# Patient Record
Sex: Male | Born: 1935 | Race: White | Hispanic: No | Smoking: Never smoker
Health system: Southern US, Community
[De-identification: ages and names within clinical notes are randomized; demographics above are authoritative.]

## PROBLEM LIST (undated history)

## (undated) DIAGNOSIS — H3530 Unspecified macular degeneration: Secondary | ICD-10-CM

## (undated) DIAGNOSIS — I10 Essential (primary) hypertension: Secondary | ICD-10-CM

## (undated) DIAGNOSIS — I219 Acute myocardial infarction, unspecified: Secondary | ICD-10-CM

## (undated) DIAGNOSIS — J449 Chronic obstructive pulmonary disease, unspecified: Secondary | ICD-10-CM

## (undated) DIAGNOSIS — C44519 Basal cell carcinoma of skin of other part of trunk: Secondary | ICD-10-CM

## (undated) DIAGNOSIS — H3560 Retinal hemorrhage, unspecified eye: Secondary | ICD-10-CM

## (undated) DIAGNOSIS — K219 Gastro-esophageal reflux disease without esophagitis: Secondary | ICD-10-CM

## (undated) DIAGNOSIS — I350 Nonrheumatic aortic (valve) stenosis: Secondary | ICD-10-CM

## (undated) DIAGNOSIS — I2510 Atherosclerotic heart disease of native coronary artery without angina pectoris: Secondary | ICD-10-CM

## (undated) DIAGNOSIS — C61 Malignant neoplasm of prostate: Secondary | ICD-10-CM

## (undated) HISTORY — PX: PROSTATECTOMY: SHX69

## (undated) HISTORY — PX: CATARACT EXTRACTION W/ INTRAOCULAR LENS IMPLANT: SHX1309

## (undated) HISTORY — PX: CATARACT EXTRACTION: SUR2

## (undated) HISTORY — PX: CORONARY ARTERY BYPASS GRAFT: SHX141

## (undated) HISTORY — PX: EYE SURGERY: SHX253

---

## 2011-10-03 ENCOUNTER — Ambulatory Visit: Payer: Self-pay | Admitting: Internal Medicine

## 2012-02-25 ENCOUNTER — Inpatient Hospital Stay: Payer: Self-pay | Admitting: Internal Medicine

## 2012-02-25 LAB — CBC
HCT: 40.8 % (ref 40.0–52.0)
HGB: 14.3 g/dL (ref 13.0–18.0)
MCH: 31.1 pg (ref 26.0–34.0)
MCV: 88 fL (ref 80–100)
RBC: 4.61 10*6/uL (ref 4.40–5.90)
RDW: 12.8 % (ref 11.5–14.5)
WBC: 7.3 10*3/uL (ref 3.8–10.6)

## 2012-02-25 LAB — COMPREHENSIVE METABOLIC PANEL
Albumin: 4.1 g/dL (ref 3.4–5.0)
Alkaline Phosphatase: 90 U/L (ref 50–136)
BUN: 23 mg/dL — ABNORMAL HIGH (ref 7–18)
Bilirubin,Total: 0.7 mg/dL (ref 0.2–1.0)
Calcium, Total: 9.3 mg/dL (ref 8.5–10.1)
Chloride: 103 mmol/L (ref 98–107)
Creatinine: 1.52 mg/dL — ABNORMAL HIGH (ref 0.60–1.30)
Glucose: 106 mg/dL — ABNORMAL HIGH (ref 65–99)
Osmolality: 278 (ref 275–301)
SGOT(AST): 25 U/L (ref 15–37)
SGPT (ALT): 26 U/L (ref 12–78)
Sodium: 137 mmol/L (ref 136–145)
Total Protein: 7.9 g/dL (ref 6.4–8.2)

## 2012-02-25 LAB — CK TOTAL AND CKMB (NOT AT ARMC)
CK, Total: 81 U/L (ref 35–232)
CK-MB: 2.4 ng/mL (ref 0.5–3.6)

## 2012-02-26 LAB — BASIC METABOLIC PANEL
Anion Gap: 8 (ref 7–16)
BUN: 18 mg/dL (ref 7–18)
Chloride: 108 mmol/L — ABNORMAL HIGH (ref 98–107)
Co2: 24 mmol/L (ref 21–32)
Creatinine: 1.46 mg/dL — ABNORMAL HIGH (ref 0.60–1.30)
Osmolality: 282 (ref 275–301)
Potassium: 3.9 mmol/L (ref 3.5–5.1)
Sodium: 140 mmol/L (ref 136–145)

## 2012-02-26 LAB — MAGNESIUM: Magnesium: 1.8 mg/dL

## 2012-02-26 LAB — LIPID PANEL
Cholesterol: 99 mg/dL (ref 0–200)
HDL Cholesterol: 24 mg/dL — ABNORMAL LOW (ref 40–60)
Triglycerides: 102 mg/dL (ref 0–200)
VLDL Cholesterol, Calc: 20 mg/dL (ref 5–40)

## 2012-11-13 LAB — URINALYSIS, COMPLETE
Bilirubin,UR: NEGATIVE
Blood: NEGATIVE
Glucose,UR: NEGATIVE mg/dL (ref 0–75)
Nitrite: NEGATIVE
WBC UR: 1 /HPF (ref 0–5)

## 2012-11-13 LAB — COMPREHENSIVE METABOLIC PANEL
Alkaline Phosphatase: 95 U/L (ref 50–136)
BUN: 15 mg/dL (ref 7–18)
Bilirubin,Total: 0.7 mg/dL (ref 0.2–1.0)
Calcium, Total: 8.9 mg/dL (ref 8.5–10.1)
Chloride: 100 mmol/L (ref 98–107)
Co2: 26 mmol/L (ref 21–32)
Creatinine: 1.36 mg/dL — ABNORMAL HIGH (ref 0.60–1.30)
EGFR (African American): 52 — ABNORMAL LOW
Osmolality: 268 (ref 275–301)
Potassium: 4.6 mmol/L (ref 3.5–5.1)
SGPT (ALT): 25 U/L (ref 12–78)
Total Protein: 7.3 g/dL (ref 6.4–8.2)

## 2012-11-13 LAB — CBC
HCT: 39.2 % — ABNORMAL LOW (ref 40.0–52.0)
HGB: 13.5 g/dL (ref 13.0–18.0)
MCH: 30.2 pg (ref 26.0–34.0)
MCHC: 34.6 g/dL (ref 32.0–36.0)
RBC: 4.49 10*6/uL (ref 4.40–5.90)
RDW: 12.8 % (ref 11.5–14.5)

## 2012-11-13 LAB — CK TOTAL AND CKMB (NOT AT ARMC): CK, Total: 121 U/L (ref 35–232)

## 2012-11-14 ENCOUNTER — Observation Stay: Payer: Self-pay | Admitting: Specialist

## 2012-11-14 LAB — BASIC METABOLIC PANEL
Calcium, Total: 8.7 mg/dL (ref 8.5–10.1)
Chloride: 99 mmol/L (ref 98–107)
Co2: 27 mmol/L (ref 21–32)
Creatinine: 1.51 mg/dL — ABNORMAL HIGH (ref 0.60–1.30)
Osmolality: 268 (ref 275–301)
Sodium: 133 mmol/L — ABNORMAL LOW (ref 136–145)

## 2012-11-14 LAB — TROPONIN I: Troponin-I: 0.06 ng/mL — ABNORMAL HIGH

## 2012-11-14 LAB — CK TOTAL AND CKMB (NOT AT ARMC)
CK-MB: 0.7 ng/mL (ref 0.5–3.6)
CK-MB: 0.5 ng/mL (ref 0.5–3.6)

## 2012-11-15 LAB — CBC WITH DIFFERENTIAL/PLATELET
Basophil %: 0.4 %
Eosinophil %: 0.4 %
HGB: 12.4 g/dL — ABNORMAL LOW (ref 13.0–18.0)
Lymphocyte #: 0.7 10*3/uL — ABNORMAL LOW (ref 1.0–3.6)
Lymphocyte %: 14.7 %
MCH: 29.7 pg (ref 26.0–34.0)
MCHC: 34.1 g/dL (ref 32.0–36.0)
MCV: 87 fL (ref 80–100)
Monocyte %: 10.4 %
Platelet: 99 10*3/uL — ABNORMAL LOW (ref 150–440)
WBC: 4.6 10*3/uL (ref 3.8–10.6)

## 2012-11-15 LAB — BASIC METABOLIC PANEL
BUN: 18 mg/dL (ref 7–18)
Calcium, Total: 8.2 mg/dL — ABNORMAL LOW (ref 8.5–10.1)
Creatinine: 1.3 mg/dL (ref 0.60–1.30)
EGFR (African American): 55 — ABNORMAL LOW
Osmolality: 270 (ref 275–301)
Sodium: 134 mmol/L — ABNORMAL LOW (ref 136–145)

## 2012-11-15 LAB — MAGNESIUM: Magnesium: 1.5 mg/dL — ABNORMAL LOW

## 2012-11-15 LAB — LIPID PANEL
HDL Cholesterol: 20 mg/dL — ABNORMAL LOW (ref 40–60)
Triglycerides: 105 mg/dL (ref 0–200)

## 2012-11-20 LAB — CULTURE, BLOOD (SINGLE)

## 2014-01-05 DIAGNOSIS — I739 Peripheral vascular disease, unspecified: Secondary | ICD-10-CM

## 2014-01-05 DIAGNOSIS — I10 Essential (primary) hypertension: Secondary | ICD-10-CM | POA: Insufficient documentation

## 2014-01-05 DIAGNOSIS — I779 Disorder of arteries and arterioles, unspecified: Secondary | ICD-10-CM | POA: Insufficient documentation

## 2014-01-05 DIAGNOSIS — Z8679 Personal history of other diseases of the circulatory system: Secondary | ICD-10-CM | POA: Insufficient documentation

## 2014-01-05 DIAGNOSIS — D649 Anemia, unspecified: Secondary | ICD-10-CM | POA: Insufficient documentation

## 2014-01-05 DIAGNOSIS — E785 Hyperlipidemia, unspecified: Secondary | ICD-10-CM | POA: Insufficient documentation

## 2014-01-05 DIAGNOSIS — I219 Acute myocardial infarction, unspecified: Secondary | ICD-10-CM | POA: Insufficient documentation

## 2014-01-05 DIAGNOSIS — N289 Disorder of kidney and ureter, unspecified: Secondary | ICD-10-CM | POA: Insufficient documentation

## 2014-05-12 DIAGNOSIS — M5431 Sciatica, right side: Secondary | ICD-10-CM | POA: Insufficient documentation

## 2014-09-21 ENCOUNTER — Emergency Department: Payer: Self-pay | Admitting: Emergency Medicine

## 2014-10-13 NOTE — Discharge Summary (Signed)
PATIENT NAME:  Kevin Tyler, Kevin Tyler MR#:  [id number] DATE OF BIRTH:  Nov 11, 1917  DATE OF ADMISSION:  02/25/2012 DATE OF DISCHARGE:  02/26/2012  PRESENTING COMPLAINT: Dizziness and low blood pressure.   DISCHARGE DIAGNOSES:  1. Symptomatic bradycardia with hypotension, resolved.  2. Mild dehydration, improved with intravenous fluids.  3. Moderate to severe aortic stenosis by echocardiogram.  4. History of coronary disease, status post coronary artery bypass graft. Heart rate on discharge was 55 to 61.   MEDICATIONS:  1. Simvastatin 20 mg p.o. at bedtime.  2. Rapaflo 8 mg daily.  3. Prilosec 40 mg daily.  4. Aspirin 81 mg 2 tablets daily.  5. Ocuvite 1 tablet daily.  6. Tylenol extra strength 500 mg 1 tablet once a day.  7. Atenolol 25 mg p.o. daily.   DIET: Low sodium diet.   FOLLOW UP:  1. Follow up with Dr. Ubaldo Glassing in 1 to 2 weeks.  2. Dr. Raechel Ache in 1 to 2 weeks.   LABORATORY, DIAGNOSTIC AND RADIOLOGICAL DATA: Echo Doppler showed left atrium dilated, left ventricular systolic function normal. Right ventricle is borderline dilated. Mild to moderate AR. There is mild mitral regurgitation. Mitral valve leaflet appears thickened but well open. There is mild to moderate tricuspid regurgitation. Moderate to severe valvular aortic stenosis. Cardiac enzymes x3 negative. Magnesium 1.8, glucose 117, BUN 18, creatinine 1.4, sodium 140, potassium 3.9. Lipid profile within normal limits. CBC within normal limits except platelet count of 131. Comprehensive metabolic panel within normal limits except creatinine of 1.52 on admission.   CONSULTATION: Cardiology consultation with Dr. Ubaldo Glassing.   BRIEF SUMMARY OF HOSPITAL COURSE: [name] is a pleasant 81 year old Caucasian gentleman with history of coronary artery disease status post coronary artery bypass graft who comes to the Emergency Room with:  1. Near syncopal episode suspected from mild dehydration with hypotension and symptomatic bradycardia. Patient  had been on a atenolol 50 mg daily for long time. His atenolol was held at admission, he improved clinically well. He was seen by Dr. Ubaldo Glassing and since patient was doing well hemodynamically stable it was recommended by Dr. Ubaldo Glassing to start atenolol 25 mg daily. His echo showed moderate to severe AS. No chest pain and cardiac enzymes remained negative. Blood pressure was stable.  2. Acute renal failure/dehydration. Creatinine improved after hydration. Patient has been eating and drinking well.  3. Carotid artery stenosis 50% to 75% left. Continue aspirin.  4. Coronary artery disease status post coronary artery bypass graft in 1993. Continue aspirin and statins along with beta blockers.  5. Benign prostatic hypertrophy on Rapaflo  6. Hospital stay otherwise remained stable. CODE STATUS: Patient remained a FULL CODE.    TIME SPENT: 40 minutes.  ____________________________ [name], MD sap:cms D: 02/27/2012 15:14:08 ET T: 02/28/2012 13:19:08 ET JOB#: [id number]  cc: Shigeru Lampert A. Posey Pronto, MD, <Dictator> Javier Docker. Ubaldo Glassing, MD Christena Flake. Raechel Ache, MD Ilda Basset MD ELECTRONICALLY SIGNED 03/11/2012 22:13

## 2014-10-13 NOTE — Consult Note (Signed)
General Aspect 81 yo male with history of severe aortic stenosis with echo in 2012 revealeing an area of 0.99 cm2 with a peak gradient of 54 and a mean gradient of 32. He was seen in our office last week and scheduled for a repeat echo. He was in church yesterday when he began noting weakness and diapharesis. He was brought to the er where he was hypotenisive with sbp of 92 and heart rate in the 40-50's. He was taken off of his atenolol and placed on hydralazine for his blood pressure. He also was on rapaflo for his prostate. He has ruled out for an mi and is imrpoved clinically able to ambulate without difficulty. He feels back to his baseline and is anxous to go home.   Physical Exam:   GEN well developed, well nourished, no acute distress    HEENT hearing intact to voice    NECK supple    RESP normal resp effort  clear BS  no use of accessory muscles    CARD Regular rate and rhythm  Murmur    Murmur Systolic    Systolic Murmur Out flow    ABD denies tenderness  no liver/spleen enlargement  normal BS    LYMPH negative neck    EXTR negative cyanosis/clubbing, negative edema    SKIN normal to palpation    NEURO cranial nerves intact, motor/sensory function intact    PSYCH A+O to time, place, person, good insight   Review of Systems:   Subjective/Chief Complaint diapharesis    General: No Complaints    Skin: No Complaints    ENT: No Complaints    Eyes: No Complaints    Neck: No Complaints    Respiratory: No Complaints    Cardiovascular: No Complaints    Gastrointestinal: No Complaints    Genitourinary: No Complaints    Musculoskeletal: No Complaints    Neurologic: Dizzness    Hematologic: No Complaints    Endocrine: No Complaints    Psychiatric: No Complaints    Review of Systems: All other systems were reviewed and found to be negative    Medications/Allergies Reviewed Medications/Allergies reviewed     Cancer, Prostate:    Hypertension:     Cardiac Disease:   Home Medications: Medication Instructions Status  simvastatin 20 mg oral tablet 1 tab(s) orally once a day (at bedtime) Active  atenolol 50 mg oral tablet 1 tab(s) orally once a day Active  Rapaflo 8 mg oral capsule 1 cap(s) orally once a day Active  Prilosec 40 mg oral delayed release capsule 1 cap(s) orally once a day Active  aspirin 81 mg oral tablet 2 tab(s) orally once a day Active  Ocuvite oral tablet 1 tab(s) orally once a day Active  Tylenol Extra Strength PM 500 mg-25 mg oral tablet 1 tab(s) orally once a day (at bedtime), As Needed Active   EKG:   EKG NSR    NKA: Hives    Impression 81 yo male with history of aortic stenosis with ava of 0.99 cm2 in 2012 now admitted with dizziness and near syncope with diaphoresis occurring while in church yesterday. He has ruled out for an mi and is asymptomatic at present. He was relatively bradycardic on admission. Etiology of symptoms is likely relative volume depletion as evidenced by his renal funciton as well as a hot environment in church. His bradycardia is stable at present. Was taken off of his beta blocker on admission. He has been on 50 mg as an  outpatient. He is hypertensive at present. Was placed on hydralazine. Echo pending    Plan 1. Would discontinue hydralazine due to his AS. Would attempt to avoid afterload reduction. 2. Will carefully resume atenolol at 25 mg and follow heart rate, blood pressure and symptoms. 3. Review echo when available 4. Ambulate and discharge to home with outpatient follow up in my office later this week if stable.   Electronic Signatures: Teodoro Spray (MD)  (Signed 02-Sep-13 09:43)  Authored: General Aspect/Present Illness, History and Physical Exam, Review of System, Past Medical History, Home Medications, EKG , Allergies, Impression/Plan   Last Updated: 02-Sep-13 09:43 by Teodoro Spray (MD)

## 2014-10-13 NOTE — H&P (Signed)
PATIENT NAME:  Kevin Tyler, Kevin Tyler MR#:  [id number] DATE OF BIRTH:  [birth year]  DATE OF ADMISSION:  02/25/2012  PRIMARY CARE PHYSICIAN: Ezequiel Kayser, MD  REFERRING PHYSICIAN: Pollie Friar, MD  CHIEF COMPLAINT: Sweating and near syncope today.   HISTORY OF PRESENT ILLNESS: The patient is a 81 year old Caucasian male with a history of coronary artery disease, hypertension, left carotid stenosis of 50 to 70%, and benign prostatic hypertrophy who presented to the ED with the above chief complaint. The patient is alert, awake, and oriented, in no acute distress. The patient said he had sweating while standing in church and started to get hazy, felt weak and almost passed out and fell down. He was laid down in another room and felt better. He denies any loss of consciousness or seizure, no headache or dizziness, no chest pain, palpitations, orthopnea, or nocturnal dyspnea. No abdominal pain, nausea, vomiting, or diarrhea. The patient's blood pressure was 90/40 and treated with IV fluids in the ED.   PAST MEDICAL HISTORY: 1. Coronary artery disease.  2. Hypertension. 3. Left carotid stenosis.  4. Benign prostatic hypertrophy, on Rapaflo.   SOCIAL HISTORY: No smoking, drinking, or illicit drugs. He living alone at home.   PAST SURGICAL HISTORY: None.  FAMILY HISTORY: No hypertension, diabetes, stroke or heart attack.   REVIEW OF SYSTEMS: CONSTITUTIONAL: The patient denies any fever or chills. No headache or dizziness but has weakness. EYES: No double vision or blurred vision, but has history of cataract status post surgery and implant. ENT: No epistaxis, postnasal drip, slurred speech, or dysphagia. RESPIRATORY: No cough, sputum, shortness of breath, or hematemesis. CARDIOVASCULAR: No chest pain, palpitations, orthopnea, or nocturnal dyspnea. No leg edema. GASTROINTESTINAL: No abdominal pain, nausea, vomiting, or diarrhea. No melena or bloody stool. GENITOURINARY: No dysuria or hematuria or  incontinence. ENDOCRINE: No polyuria, polydipsia, heat or cold intolerance. HEMATOLOGY: No easy bleeding or bleeding. SKIN: No rash or jaundice. NEUROLOGY: No syncope, loss of consciousness or seizure. PSYCH: No depression and no anxiety.   ALLERGIES: No known drug allergies.   MEDICATIONS:  1. Zocor 20 mg p.o. daily at bedtime.  2. Rapaflo 80 mg p.o. daily.  3. Prilosec 40 mg p.o. daily.  4. Atenolol 50 mg p.o. daily.  5. Aspirin 81 mg two tablets  p.o. daily.  6. Ocuvite oral tablet one tablet p.o. daily.   PHYSICAL EXAMINATION:   VITAL SIGNS: Temperature 96.8, blood pressure 145/62, pulse 58 (originally pulse was 47), and oxygen saturation 97% on room air.   GENERAL: The patient is alert, awake, and oriented, in no acute distress.   HEENT: Pupils are not equa, in irregular shapesl, not  reactive to light but accommodation.   NECK: Supple. No JVD or carotid bruit. No lymphadenopathy. No thyromegaly. Moist oral mucosa. Clear oropharynx.   CARDIOVASCULAR: S1 and S2 regular rate and rhythm. No murmurs or gallops.   PULMONARY: Bilateral air entry. No wheezing or rales.   ABDOMEN: Soft. No distention or tenderness. No organomegaly. Bowel sounds present.   EXTREMITIES: No edema, clubbing, or cyanosis. No calf tenderness.   SKIN: No rash or jaundice.   NEUROLOGIC: Alert and oriented x3. No focal deficit.  LABORATORY, DIAGNOSTIC AND RADIOLOGIC DATA: WBC 7.3, hemoglobin 14.3, and platelets 131. Glucose 106, BUN 23, and creatinine 1.52. Electrolytes are normal. Troponin less than 0.02. CK 81.   Carotid duplex in April showed left stenosis of 50 to 75%.   EKG shows marked sinus bradycardia with first degree AV block at 48 beats  per minute.   IMPRESSION:  1. Near syncope possibly due to acute renal failure, bradycardia, or hypotension.  2. Acute renal failure and dehydration.  3. Hypotension, resolved. 4. Bradycardia.  5. Carotid stenosis on the left side, 50 to 75%.  6. Coronary  artery disease.  7. Hypertension.   PLAN OF TREATMENT: The patient will be admitted to the telemetry floor. We will get an echocardiogram. Hold Rapaflo and atenolol due to bradycardia and hypotension. We will start hydralazine 25 mg four times daily for hypertension. We will start IV fluid with normal saline at 75 mL/h for dehydration and acute renal failure and follow up BMP. Deep vein thrombosis prophylaxis with TEDs due to thrombocytopenia.   I discussed the patient's situation and plan of treatment with the patient.   TIME SPENT: About 55 minutes. ____________________________ Demetrios Loll, MD qc:slb D: 02/25/2012 17:06:19 ET     T: 02/26/2012 09:45:32 ET        JOB#: [id number] cc: Demetrios Loll, MD, <Dictator> Christena Flake. Raechel Ache, MD Demetrios Loll MD ELECTRONICALLY SIGNED 02/26/2012 14:18

## 2014-10-16 NOTE — Consult Note (Signed)
Chief Complaint:  Subjective/Chief Complaint Pt states to still have some weknes but denies syncope. He want to be more active but wekness and vertio prevents this   VITAL SIGNS/ANCILLARY NOTES: **Vital Signs.:   23-May-14 08:00  Vital Signs Type Routine  Temperature Temperature (F) 98.2  Celsius 36.7  Temperature Source oral  Pulse Pulse 63  Respirations Respirations 18  Systolic BP Systolic BP 053  Diastolic BP (mmHg) Diastolic BP (mmHg) 62  Mean BP 84  Pulse Ox % Pulse Ox % 95  Pulse Ox Activity Level  At rest  Oxygen Delivery Room Air/ 21 %  *Intake and Output.:   Daily 23-May-14 07:00  Grand Totals Intake:  1131 Output:  700    Net:  431 24 Hr.:  431  Oral Intake      In:  0  IV (Primary)      In:  1131  Urine ml     Out:  700  Length of Stay Totals Intake:  1381 Output:  1000    Net:  381    08:25  Oral Intake      In:  120  Unmeasured Intake  Few bites   Brief Assessment:  Cardiac Regular  murmur present  + carotid bruits  + LE edema  + JVD  --Gallop   Respiratory normal resp effort  clear BS   Gastrointestinal Normal   Gastrointestinal details normal Soft   Lab Results: LabObservation:  22-May-14 08:19   OBSERVATION Reason for Test  Cardiology:  22-May-14 08:19   Echo Doppler REASON FOR EXAM:     COMMENTS:     PROCEDURE: [facility] - ECHO DOPPLER COMPLETE(TRANSTHOR)  - Nov 14 2012  8:19AM   RESULT: Echocardiogram Report  Patient Name:   Kevin Tyler Keisling Date of Exam: 11/14/2012 Medical Rec #:  [id number] Custom1: Date of Birth:  07/10/1917              Height:       68.0 in Patient Age:    81 years               Weight:       158.0 lb Patient Gender: M                      BSA:          1.85 m??  Indications: SOB Sonographer:    Sherrie Sport RDCS Referring Phys: Nicholes Mango  Summary:  1. Left ventricular ejection fraction, by visual estimation, is 50 to  55%.  2. Mildly increased left ventricular septal thickness.  3. Mildly dilated left  atrium.  4. Mildly dilated right atrium.  5. Mild mitral valve regurgitation.  6. Thickening of the anterior and posterior mitral valve leaflets.  7. Mildly increased left ventricular internal cavity size.  8. Mild aortic regurgitation.  9. Normal aortic valve, without any evidence of aortic stenosis or  insufficiency. 10. Moderately increased left ventricular posterior wall thickness. 11. Mild tricuspid regurgitation. 2D AND M-MODE MEASUREMENTS (normal ranges within parentheses): Left Ventricle:          Normal IVSd (2D):      1.50 cm (0.7-1.1) LVPWd (2D):     1.32 cm (0.7-1.1) Aorta/LA:                  Normal LVIDd (2D):     5.22 cm (3.4-5.7) Aortic Root (2D): 2.50 cm (2.4-3.7) LVIDs (2D):     3.79 cm  Left Atrium (2D): 4.80 cm (1.9-4.0) LV FS (2D):     27.5 %   (>25%) LV EF (2D):     53.0 %   (>50%)                                   Right Ventricle:                                   RVd (2D): LV DIASTOLIC FUNCTION: MV Peak E: 1.34 m/s E/e' Ratio: 23.30 MV Peak A: 1.43 m/s Decel Time: 257 msec E/A Ratio: 0.94 SPECTRAL DOPPLER ANALYSIS (where applicable): Mitral Valve: MV P1/2 Time: 74.53 msec MV Area, PHT: 2.95 cm?? Aortic Valve: AoV Max Vel: 3.97 m/s AoV Peak PG: 63.1 mmHg AoV Mean PG:  44.3 mmHg LVOT Vmax: 0.94 m/s LVOT VTI: 0.257 m LVOT Diameter: 2.20 cm AoV Area, Vmax: 0.90 cm?? AoV Area, VTI: 1.00 cm?? AoV Area, Vmn: 0.90 cm?? Aortic Insufficiency: AI Half-time:  430 msec AI Decel Rate: 2.87 m/s?? Tricuspid Valve and PA/RV Systolic Pressure: TR Max Velocity: 2.94 m/s RA  Pressure: 65mHg RVSP/PASP: 39.5 mmHg Pulmonic Valve: PV Max Velocity: 0.88 m/s PV Max PG: 3.1 mmHg PV Mean PG: PHYSICIAN INTERPRETATION: Left Ventricle: The left ventricular internal cavity size was mildly  increased. LV septal wall thickness was mildly increased. LV posterior  wall thickness was moderately increased. Left ventricular ejection  fraction, by visual estimation, is 50 to  55%. Right Ventricle: The right ventricular size is mildly enlarged. Global RV  systolic function is low normal. Left Atrium: The left atrium is mildly dilated. Right Atrium: The right atrium is mildly dilated. Pericardium: There is no evidence of pericardial effusion. Mitral Valve: The mitral valve is normal in structure. There is  thickening of the anteriorand posterior mitral valve leaflets. Mild  mitral valve regurgitation is seen. Tricuspid Valve: The tricuspid valve is normal. Mild tricuspid  regurgitation is visualized. The tricuspid regurgitant velocity is 2.94   m/s, and with an assumed right atrial pressure of 5 mmHg, the estimated  right ventricular systolic pressure is normal at 39.5 mmHg. Aortic Valve: The aortic valve is trileaflet and structurally normal,  with normal leaflet excursion; without any evidence of aortic stenosis or  insufficiency. The aortic valve is normal. Moderate to severe aortic  stenosis is present. Mild aortic valve regurgitation is seen. Mod to  severe Aov Stenosis Valve area 0.9cm2. Pulmonic Valve: The pulmonic valve is normal. Trace pulmonic valve  regurgitation.  1[place] Valley LakeMD Electronically signed by 1Loon LakeDLujean AmelMD Signature Date/Time: 11/14/2012/4:48:07 PM  *** Final *** IMPRESSION: .    Verified By: DYolonda Kida M.D., MD  Routine Chem:  22-May-14 05:35   Glucose, Serum  101  BUN 18  Creatinine (comp)  1.51  Sodium, Serum  133  Potassium, Serum 4.1  Chloride, Serum 99  CO2, Serum 27  Calcium (Total), Serum 8.7  Anion Gap 7  Osmolality (calc) 268  eGFR (African American)  46  eGFR (Non-African American)  40 (eGFR values <666mmin/1.73 m2 may be an indication of chronic kidney disease (CKD). Calculated eGFR is useful in patients with stable renal function. The eGFR calculation will not be reliable in acutely ill patients when serum creatinine is changing rapidly. It is not useful in  patients on  dialysis. The eGFR calculation may not be  applicable to patients at the low and high extremes of body sizes, pregnant women, and vegetarians.)  Result Comment TROPONIN - RESULTS VERIFIED BY REPEAT TESTING.  - PREVIOUS CALL:11/13/12 AT 2254.Marland KitchenTPL  Result(s) reported on 14 Nov 2012 at 06:34AM.    13:57   Result Comment troponin - RESULTS VERIFIED BY REPEAT TESTING.  - previously called 22:54 11-13-12 by TPL  Result(s) reported on 14 Nov 2012 at 03:02PM.  Cardiac:  22-May-14 05:35   Troponin I  0.06 (0.00-0.05 0.05 ng/mL or less: NEGATIVE  Repeat testing in 3-6 hrs  if clinically indicated. >0.05 ng/mL: POTENTIAL  MYOCARDIAL INJURY. Repeat  testing in 3-6 hrs if  clinically indicated. NOTE: An increase or decrease  of 30% or more on serial  testing suggests a  clinically important change)  CK, Total 98  CPK-MB, Serum 0.7 (Result(s) reported on 14 Nov 2012 at 06:31AM.)    13:57   Troponin I  0.06 (0.00-0.05 0.05 ng/mL or less: NEGATIVE  Repeat testing in 3-6 hrs  if clinically indicated. >0.05 ng/mL: POTENTIAL  MYOCARDIAL INJURY. Repeat  testing in 3-6 hrs if  clinically indicated. NOTE: An increase or decrease  of 30% or more on serial  testing suggests a  clinically important change)  CK, Total 97  CPK-MB, Serum 0.5 (Result(s) reported on 14 Nov 2012 at 02:53PM.)  Routine Coag:  22-May-14 11:55   D-Dimer, Quantitative  1.57 (If the D-dimer test is being used to assist in the exclusion of DVT and/or PE, note the following:  In various studies concerning the D-dimer methodology (STA Liatest) in use by this laboratory, it has been reported that with a cut-off value of 0.50 ug/mL FEU, the  negative predictive value regarding the exclusion of thrombosis is within the 95-100% range.  In patients with high pre-test probability of DVT/PE the results of the D-dimer test should be correlated with other diagnostic and clinical assessment modalities." Reference: [place]., 2005.)   Radiology Results: XRay:    21-May-14 22:05, Chest 1 View AP or PA  Chest 1 View AP or PA   REASON FOR EXAM:    Shorth of breath  COMMENTS:       PROCEDURE: DXR - DXR CHEST 1 VIEWAP OR PA  - Nov 13 2012 10:05PM     RESULT: Comparison: None.    Findings:  The heart is normal in size. Prior median sternotomy and CABG. The lung   volumes are slightly diminished. No focal pulmonary opacities.    IMPRESSION:   No acute cardiopulmonary disease.    Dictation site: 2    Verified By: Gregor Hams, M.D., MD  [place]:    22-May-14 09:54, US Carotid Doppler Bilateral  US Carotid Doppler Bilateral   REASON FOR EXAM:    dizzy  COMMENTS:       PROCEDURE: [place]  - US CAROTID DOPPLER BILATERAL  - Nov 14 2012  9:54AM     RESULT: Comparison: None    Technique: Gray-scale, color Doppler, and spectral Doppler images were   obtained of the extracranial carotid artery systems and vertebral   arteries in the neck.    Findings:  Mild atherosclerotic plaque is demonstrated in the right carotid bulb and   internal carotid artery. By visual inspection, there is less than 50%   stenosis. The peak systolic velocities are not elevated. Atherosclerotic     plaque is demonstrated in the left carotid bulb and internal carotid   artery. By visual inspection, there is  at least a 50% stenosis. The peak   systolic velocity in the left mid internal carotid artery is156 cm/sec.    The peak systolic velocity in the distal left internal carotid artery is   225cm/sec. This correlates with a 50-75% stenosis.    The vertebral arteries are patent bilaterally.    IMPRESSION:      1. Findings concerning for a 50-75% stenosis in the left internal carotid   artery. Further evaluation could be provided with CTA of the neck.  2. No evidence of hemodynamically significant stenosis in the right   extracranial carotid artery.  Dictation site: 2        Verified By: Gregor Hams, M.D., MD   Cardiology:    21-May-14 21:56, ED ECG  Ventricular Rate 62  Atrial Rate 62  P-R Interval 234  QRS Duration 104  QT 400  QTc 406  P Axis 39  R Axis 5  T Axis 62  ECG interpretation   Sinus rhythm with 1st degree A-V block  Minimal voltage criteria for LVH, may be normal variant  Inferior infarct (cited on or before 25-Feb-2012)  Abnormal ECG  When compared with ECG of 25-Feb-2012 11:57,  No significant change was found  ----------unconfirmed----------  Confirmed by OVERREAD, NOT ([id number]), editor PEARSON, BARBARA ([id number]) on 11/14/2012 8:13:15 AM  ED ECG     22-May-14 08:19, Echo Doppler  Echo Doppler   REASON FOR EXAM:      COMMENTS:       PROCEDURE: [place] - ECHO DOPPLER COMPLETE(TRANSTHOR)  - Nov 14 2012  8:19AM     RESULT: Echocardiogram Report    Patient Name:   Kevin Tyler Mota Date of Exam: 11/14/2012  Medical Rec #:  [id number] Custom1:  Date of Birth:  1918/03/27              Height:       68.0 in  Patient Age:    81 years               Weight:       158.0 lb  Patient Gender: M                      BSA:          1.85 m??    Indications: SOB  Sonographer:    Sherrie Sport RDCS  Referring Phys: Nicholes Mango    Summary:   1. Left ventricular ejection fraction, by visual estimation, is 50 to   55%.   2. Mildly increased left ventricular septal thickness.   3. Mildly dilated left atrium.   4. Mildly dilated right atrium.   5. Mild mitral valve regurgitation.   6. Thickening of the anterior and posterior mitral valve leaflets.   7. Mildly increased left ventricular internal cavity size.   8. Mild aortic regurgitation.   9. Normal aortic valve, without any evidence of aortic stenosis or   insufficiency.  10. Moderately increased left ventricular posterior wall thickness.  11. Mild tricuspid regurgitation.  2D AND M-MODE MEASUREMENTS (normal ranges within parentheses):  Left Ventricle:          Normal  IVSd (2D):      1.50 cm (0.7-1.1)  LVPWd (2D):     1.32 cm (0.7-1.1)  Aorta/LA:                  Normal  LVIDd (2D):     5.22 cm (3.4-5.7) Aortic Root (2D): 2.50 cm (2.4-3.7)  LVIDs (2D):     3.79 cm           Left Atrium (2D): 4.80 cm (1.9-4.0)  LV FS (2D):     27.5 %   (>25%)  LV EF (2D):     53.0 %   (>50%)                                    Right Ventricle:                                    RVd (2D):  LV DIASTOLIC FUNCTION:  MV Peak E: 1.34 m/s E/e' Ratio: 23.30  MV Peak A: 1.43 m/s Decel Time: 257 msec  E/A Ratio: 0.94  SPECTRAL DOPPLER ANALYSIS (where applicable):  Mitral Valve:  MV P1/2 Time: 74.53 msec  MV Area, PHT: 2.95 cm??  Aortic Valve: AoV Max Vel: 3.97 m/s AoV Peak PG: 63.1 mmHg AoV Mean PG:   44.3 mmHg  LVOT Vmax: 0.94 m/s LVOT VTI: 0.257 m LVOT Diameter: 2.20 cm  AoV Area, Vmax: 0.90 cm?? AoV Area, VTI: 1.00 cm?? AoV Area, Vmn: 0.90 cm??  Aortic Insufficiency:  AI Half-time:  430 msec  AI Decel Rate: 2.87 m/s??  Tricuspid Valve and PA/RV Systolic Pressure: TR Max Velocity: 2.94 m/s RA   Pressure: 50mHg RVSP/PASP: 39.5 mmHg  Pulmonic Valve:  PV Max Velocity: 0.88 m/s PV Max PG: 3.1 mmHg PV Mean PG:  PHYSICIAN INTERPRETATION:  Left Ventricle: The left ventricular internal cavity size was mildly   increased. LV septal wall thickness was mildly increased. LV posterior   wall thickness was moderately increased. Left ventricular ejection   fraction, by visual estimation, is 50 to 55%.  Right Ventricle: The right ventricular size is mildly enlarged. Global RV   systolic function is low normal.  Left Atrium: The left atrium is mildly dilated.  Right Atrium: The right atrium is mildly dilated.  Pericardium: There is no evidence of pericardial effusion.  Mitral Valve: The mitral valve is normal in structure. There is   thickening of the anteriorand posterior mitral valve leaflets. Mild   mitral valve regurgitation is seen.  Tricuspid Valve: The tricuspid valve is normal. Mild tricuspid   regurgitation is visualized. The tricuspid  regurgitant velocity is 2.94     m/s, and with an assumed right atrial pressure of 5 mmHg, the estimated   right ventricular systolic pressure is normal at 39.5 mmHg.  Aortic Valve: The aortic valve is trileaflet and structurally normal,   with normal leaflet excursion; without any evidence of aortic stenosis or   insufficiency. The aortic valve is normal. Moderate to severe aortic   stenosis is present. Mild aortic valve regurgitation is seen. Mod to   severe Aov Stenosis Valve area 0.9cm2.  Pulmonic Valve: The pulmonic valve is normal. Trace pulmonic valve   regurgitation.    1[place] EastMD  Electronically signed by 1[place]MD  Signature Date/Time: 11/14/2012/4:48:07 PM    *** Final ***  IMPRESSION: .        Verified By: DYolonda Kida M.D., MD    23-May-14 07:36, ECG  Ventricular Rate 63  Atrial Rate 63  P-R Interval 192  QRS Duration 106  QT 408  QTc 417  P Axis 44  R Axis 28  T Axis 75  ECG interpretation   Normal sinus rhythm  Normal ECG  When compared with ECG of 13-Nov-2012 21:56,  No significant change was found  Confirmed by FATH, KEN ([id number]) on 11/15/2012 2:36:20 PM    Overreader: Jordan Hawks  ECG   CT:    22-May-14 00:11, CT Head Without Contrast  CT Head Without Contrast   REASON FOR EXAM:    dizziness headache  COMMENTS:       PROCEDURE: CT  - CT HEAD WITHOUT CONTRAST  - Nov 14 2012 12:11AM     RESULT: Axial noncontrast CT scanning was performed through the brain   with reconstructions at 5 mm intervals and slice thicknesses.    There is mild diffuse cerebral and cerebellar atrophy with compensatory   ventriculomegaly. There is no shift of the midline. Decreased density in   the deep white matter of both cerebral hemispheres consistent with   chronic small vessel ischemic type change. There is no evidence of an   acute intracranial hemorrhage nor of an evolving ischemic infarction.    At bone window settings the observed  portions of the paranasal sinuses     and mastoid air cells are clear. There is no evidence of an acute skull   fracture. Irregularity of the skin surface over the nose is present   consistent with recent surgery.    IMPRESSION:   1. There is no evidence of an acute ischemic or hemorrhagic infarction  2. There is no intracranial mass effect or hydrocephalus.  3. There are white matter density changes consistent with chronic small   vessel ischemia.    A preliminary report was sent to the emergency department at the   conclusion of the study.     Dictation Site: 1   Dictation Site: 1        Verified By: DAVID A. [place], M.D., MD  Nuclear Med:    22-May-14 16:05, Lung VQ Scan - Nuc Med  Lung VQ Scan - Nuc Med   REASON FOR EXAM:    chest pain with deep breathing, positive D dimer  COMMENTS:       PROCEDURE: NM  - NM VQ LUNG SCAN  - Nov 14 2012  4:05PM     RESULT: Comparison: Chest radiograph 11/13/2012    Radiopharmaceutical: 41.56 mCi Tc-45mDTPA inhaled gasvia a nebulizer   for ventilation; 4.202 mCi Tc-932mAA administered intravenously for   perfusion.    Technique: Standard anterior, posterior, and oblique images were obtained   for the ventilation study; anterior, posterior, and oblique images were  obtained for the perfusion study. The ventilation and perfusion studies   were compared with a recent chest radiograph.  Findings:   There is decreased ventilation to the bilateral upper lungs. Decreased   perfusion to the are upper lungs is matched with the ventilation images.   There is a small perfusion defect in the posterior inferior left lung on   the lateral view, which is matched with the ventilation image. No   segmental mismatched perfusion defects identified.    IMPRESSION:   Low probability for pulmonary embolus.    Dictation site: 2        Verified By: ROGregor HamsM.D., MD   Assessment/Plan:  Assessment/Plan:  Assessment IMP Near  syncope Vertigo Weakness Murmur left ear mod/severe CRI Elevated troponins . Plan ROMI F/U cpks F/U troponins Agree with ECHO w/u Mod/ severe left ear -I do not rec surgigal option Agree with long anticou Vascular  rec mediacl thery PVD \F/U Fath in the offine 2 weeks   Electronic Signatures: Lujean Amel D (MD)  (Signed 24-May-14 00:34)  Authored: Chief Complaint, VITAL SIGNS/ANCILLARY NOTES, Brief Assessment, Lab Results, Radiology Results, Assessment/Plan   Last Updated: 24-May-14 00:34 by Yolonda Kida (MD)

## 2014-10-16 NOTE — H&P (Signed)
PATIENT NAME:  Kevin Tyler, Kevin Tyler MR#:  [id number] DATE OF BIRTH:  04-09-1918  DATE OF ADMISSION:  11/14/2012  PRIMARY CARE PHYSICIAN:  Dr. Raechel Ache.   REFERRING PHYSICIAN:  Dr. Owens Shark.   CARDIOLOGY:  Dr. Ubaldo Glassing.   CHIEF COMPLAINT:  Weakness, dizziness and being uncomfortable for three days.   HISTORY OF PRESENT ILLNESS:  The patient is a 81 year old pleasant Caucasian male with past medical history of coronary artery disease, left carotid stenosis of 50% to 70% and moderate to severe aortic stenosis, is presenting to the ER with a chief complaint of a three day history of weakness and dizziness and being uncomfortable.  The patient has reported that last Monday he had a skin surgery done on his nose for skin cancer following which he was uncomfortable and could not sleep, feeling weak and dizzy.  Denies any fall or passing out.  Denies any chest pain, but feeling uncomfortable.  In the ER, CAT scan of the head is done which showed no acute findings.  Chest x-ray revealed no acute findings.  The patient's troponin is slightly detectable at 0.07, but patient denies absolutely no chest pain.  Hospitalist team is called to admit the patient to rule out acute coronary syndrome.  No family members are at bedside.   PAST MEDICAL HISTORY:  Coronary artery disease status post CABG, hypertension, left carotid artery stenosis, 50% to 70% stenosis, benign prostatic hypertrophy.   PAST SURGICAL HISTORY:  Coronary artery bypass grafting.   ALLERGIES:  No known drug allergies.   PSYCHOSOCIAL HISTORY:  Lives alone at home.  No smoking, alcohol or illicit drug usage.   FAMILY HISTORY:  Denies any history of hypertension, diabetes, stroke.   HOME MEDICATIONS:  Tylenol Extra Strength PM 1 tablet by mouth once daily at bedtime, simvastatin 20 mg by mouth at bedtime, Rapaflo 8 mg 1 capsule by mouth once daily, Prilosec 40 mg once daily, Ocuvite 1 tablet by mouth once daily, atenolol 25 mg once daily, aspirin 81 mg once  daily.    REVIEW OF SYSTEMS: CONSTITUTIONAL:  Denies any weight loss or weight gain.  Complaining of fatigue and weakness.  EYES:  Denies any glaucoma or redness, inflammation.  EARS, NOSE, THROAT:  No epistaxis, discharge, difficulty in swallowing.  RESPIRATION:  No cough or wheezing.  Denies any hemoptysis or dyspnea.  CARDIOVASCULAR:  No chest pain, palpitations.  Complaining of dizziness, but no syncope.  GASTROINTESTINAL:  No nausea, vomiting, diarrhea.  Denies any GERD.  GENITOURINARY:  No dysuria, hematuria.  No hernias.  ENDOCRINE:  No polyuria, nocturia or thyroid problems.  HEMATOLOGIC AND LYMPHATIC:  No anemia, easy bruising or bleeding.  INTEGUMENTARY:  No acne.  No rash.  Skin cancer from the nose is shaved by the dermatologist on last Monday.  MUSCULOSKELETAL:  No joint pain in the neck, back, shoulder.  Denies any gout.  NEUROLOGIC:  No vertigo or dementia or headache.  Denies any CVA or TIA.  PSYCHIATRIC:  No ADD, OCD or bipolar disorder.   PHYSICAL EXAMINATION: VITAL SIGNS:  Temperature 98.8, pulse of 66, respirations 16, blood pressure 140/60, pulse ox 93%.  GENERAL APPEARANCE:  Not under acute distress.  Moderately built and moderately nourished.  HEENT:  Normocephalic, atraumatic.  Pupils are equally reactive to light and accommodation.  No scleral icterus.  No conjunctival injection.  No sinus tenderness.  No postnasal drip.  No pharyngeal exudates.  Nose, status post skin surgery for skin cancer and has an intact bandage.  NECK:  Supple.  No JVD.  No thyromegaly.  No lymphadenopathy.  Positive left-sided carotid bruit.  LUNGS:  Clear to auscultation bilaterally.  No accessory muscle usage.  No anterior chest wall tenderness on palpation.  CARDIAC:  S1, S2 normal.  Regular rate and rhythm.  Positive ejection systolic murmur.  No edema. GASTROINTESTINAL:  Soft.  Bowel sounds are positive in all four quadrants.  Nontender, nondistended.  No masses felt.  No  hepatosplenomegaly.  NEUROLOGIC:  Awake, alert, oriented x 3.  Cranial nerves II through XII are intact.  Motor and sensory are grossly intact.  Reflexes are 2+. EXTREMITIES:  No edema.  No cyanosis.  No clubbing.  SKIN:  No masses.  No rashes.  Positive skin lesion on the nose, is status post surgery last Monday.  Normal turgor.  Warm to touch.   MUSCULOSKELETAL:  No joint effusion, tenderness, erythema.  LABORATORY AND IMAGING STUDIES:  Chest x-ray, prominent vasculature, but no acute findings.  A 12-lead EKG has revealed sinus rhythm at 62 beats per minute.  Prolonged PR interval at 234 with first degree AV block, normal QRS and corrected QT intervals.  Minimal  criteria for left ventricular hypertrophy.  Inferior infarct of age undetermined.  CAT scan of the head with no acute findings.  Urinalysis, yellow in color, clear in appearance.  Leukocyte esterase and nitrites are negative.  WBC 6.7, hemoglobin 13.5, hematocrit 39.2, platelets 170, MCV 87.  CK total 121, CK-MB 0.9, troponin 0.07.  LFTs are within normal range.  Glucose 117, BUN 15, creatinine 1.36, which is much better than his baseline at 1.4, sodium 133, potassium 4.6, chloride 100, CO2 26, GFR 45, anion gap 7, calcium 8.9, serum osmolality 258.   ASSESSMENT AND PLAN:  A 81 year old Caucasian male presenting to the ER with a chief complaint of weakness, dizziness and discomfort with detectable troponin, will be admitted with the following assessment and plan.   1.  Near-syncope probably from a chronic history of moderate to severe aortic stenosis and 70% stenosis of the left carotid artery.  We will admit him to telemetry.  Cycle cardiac biomarkers.  We will obtain 2-D echocardiogram.  Carotid Dopplers.  Consult Dr. Ubaldo Glassing.  The patient will be on aspirin and beta-blocker as well as statin.  2.  Detectable troponin, probably from cardiac strain from moderate to severe aortic stenosis.  We will cycle cardiac biomarkers and monitor him on  telemetry.  We will check orthostatics.  3.  Thrombocytopenia.  As patient is being on aspirin we will monitor it closely.  No bleeding or bruising at this time.  4.  Hypertension.  Stable.  Resume his home medication.  5.  Benign prostatic hypertrophy.  Resume his home medication.  6.  We will provide him gastrointestinal prophylaxis and deep vein thrombosis prophylaxis with SCDs only, in view of thrombocytopenia.  7.  The patient is DO NOT RESUSCITATE.   Total time spent on the admission is 50 minutes.   The diagnosis and plan of care was discussed in detail with the patient.  He is aware of the plan.      ____________________________ Nicholes Mango, MD ag:ea D: 11/14/2012 02:58:23 ET T: 11/14/2012 04:18:30 ET JOB#: [id number]  cc: Nicholes Mango, MD, <Dictator> Javier Docker. Ubaldo Glassing, MD Christena Flake. Raechel Ache, MD  Nicholes Mango MD ELECTRONICALLY SIGNED 11/23/2012 6:47

## 2014-10-16 NOTE — Consult Note (Signed)
CHIEF COMPLAINT and HISTORY:  Subjective/Chief Complaint dizziness   History of Present Illness 81 yo WM admitted with dizziness.  Did not have focal arm or leg weakness or numbness, speech or swallowing issues, or visual symptoms.  Has history of AS and documented in H&P to have left carotid stenosis 50-70%.  [place] here shows left ICA stenosis 50-75% no significant right ICA stenosis and patent vertebral arteries bilaterally.   PAST MEDICAL/SURGICAL HISTORY:  Past Medical History:   Faulty Aortic Valve:    Degenerative Eye disease:    Cancer, Prostate:    Hypertension:    Cardiac Disease:   ALLERGIES:  Allergies:  No Known Allergies:   HOME MEDICATIONS:  Home Medications: Medication Instructions Status  simvastatin 20 mg oral tablet 1 tab(s) orally once a day (at bedtime) Active  Rapaflo 8 mg oral capsule 1 cap(s) orally once a day Active  Prilosec 40 mg oral delayed release capsule 1 cap(s) orally once a day Active  aspirin 81 mg oral tablet 2 tab(s) orally once a day Active  Ocuvite oral tablet 1 tab(s) orally once a day Active  Tylenol Extra Strength PM 500 mg-25 mg oral tablet 1 tab(s) orally once a day (at bedtime), As Needed Active  atenolol 25 mg oral tablet 1 tab(s) orally once a day Active   Family and Social History:  Family History Non-Contributory   Social History negative tobacco, negative ETOH   Place of Living Home   Review of Systems:  Fever/Chills No   Cough No   Sputum No   Abdominal Pain No   Diarrhea No   Constipation No   Nausea/Vomiting No   SOB/DOE No   Chest Pain No   Dysuria No   Tolerating PT Yes   Tolerating Diet No   Medications/Allergies Reviewed Medications/Allergies reviewed   Physical Exam:  GEN well developed, well nourished   HEENT pink conjunctivae, moist oral mucosa   NECK No masses  trachea midline   RESP clear BS  no use of accessory muscles   CARD regular rate  murmur present  positive carotid bruits    ABD normal BS  no Adominal Mass   LYMPH negative axillae   EXTR negative cyanosis/clubbing, negative edema   SKIN nose surgery dressed   NEURO motor/sensory function intact   PSYCH alert, A+O to time, place, person   LABS:  Laboratory Results: LabObservation:    22-May-14 08:37, Echo Doppler  OBSERVATION   Reason for Test  Hepatic:    21-May-14 21:48, Comprehensive Metabolic Panel  Bilirubin, Total 0.7  Alkaline Phosphatase 95  SGPT (ALT) 25  SGOT (AST) 31  Total Protein, Serum 7.3  Albumin, Serum 3.5  Cardiology:    21-May-14 21:56, ED ECG  Ventricular Rate 62  Atrial Rate 62  P-R Interval 234  QRS Duration 104  QT 400  QTc 406  P Axis 39  R Axis 5  T Axis 62  ECG interpretation   Sinus rhythm with 1st degree A-V block  Minimal voltage criteria for LVH, may be normal variant  Inferior infarct (cited on or before 25-Feb-2012)  Abnormal ECG  When compared with ECG of 25-Feb-2012 11:57,  No significant change was found  ----------unconfirmed----------  Confirmed by OVERREAD, NOT ([id number]), editor PEARSON, BARBARA ([id number]) on 11/14/2012 8:13:15 AM  ED ECG   Routine Chem:    21-May-14 21:48, Comprehensive Metabolic Panel  Glucose, Serum 107  BUN 15  Creatinine (comp) 1.36  Sodium, Serum 133  Potassium, Serum 4.6  Chloride, Serum 100  CO2, Serum 26  Calcium (Total), Serum 8.9  Osmolality (calc) 268  eGFR (African American) 52  eGFR (Non-African American) 45  eGFR values <98m/min/1.73 m2 may be an indication of chronic  kidney disease (CKD).  Calculated eGFR is useful in patients with stable renal function.  The eGFR calculation will not be reliable in acutely ill patients  when serum creatinine is changing rapidly. It is not useful in   patients on dialysis. The eGFR calculation may not be applicable  to patients at the low and high extremes of body sizes, pregnant  women, and vegetarians.  Anion Gap 7    21-May-14 21:48, Troponin I  Result Comment    TROPONIN - RESULTS VERIFIED BY REPEAT TESTING.   - CALLED TO LEA FURGUSON:11/13/12 AT 2254   - READ-BACK PROCESS PERFORMED.   - TPL   Result(s) reported on 13 Nov 2012 at 10:57PM.    22-May-14 069:48 Basic Metabolic Panel (w/Total Calcium)  Glucose, Serum 101  BUN 18  Creatinine (comp) 1.51  Sodium, Serum 133  Potassium, Serum 4.1  Chloride, Serum 99  CO2, Serum 27  Calcium (Total), Serum 8.7  Anion Gap 7  Osmolality (calc) 268  eGFR (African American) 46  eGFR (Non-African American) 40  eGFR values <628mmin/1.73 m2 may be an indication of chronic  kidney disease (CKD).  Calculated eGFR is useful in patients with stable renal function.  The eGFR calculation will not be reliable in acutely ill patients  when serum creatinine is changing rapidly. It is not useful in   patients on dialysis. The eGFR calculation may not be applicable  to patients at the low and high extremes of body sizes, pregnant  women, and vegetarians.    22-May-14 05:35, Troponin I  Result Comment   TROPONIN - RESULTS VERIFIED BY REPEAT TESTING.   - PREVIOUS CALL:11/13/12 AT 2254..TMarland KitchenL   Result(s) reported on 14 Nov 2012 at 06:34AM.  Cardiac:    21-May-14 21:48, Cardiac Panel  CK, Total 121  CPK-MB, Serum 0.9  Result(s) reported on 13 Nov 2012 at 10:51PM.    21-May-14 21:48, Troponin I  Troponin I 0.07  0.00-0.05  0.05 ng/mL or less: NEGATIVE   Repeat testing in 3-6 hrs   if clinically indicated.  >0.05 ng/mL: POTENTIAL   MYOCARDIAL INJURY. Repeat   testing in 3-6 hrs if   clinically indicated.  NOTE: An increase or decrease   of 30% or more on serial   testing suggests a   clinically important change    22-May-14 05:35, Cardiac Panel  CK, Total 98  CPK-MB, Serum 0.7  Result(s) reported on 14 Nov 2012 at 06:31AM.    22-May-14 05:35, Troponin I  Troponin I 0.06  0.00-0.05  0.05 ng/mL or less: NEGATIVE   Repeat testing in 3-6 hrs   if clinically indicated.  >0.05 ng/mL: POTENTIAL    MYOCARDIAL INJURY. Repeat   testing in 3-6 hrs if   clinically indicated.  NOTE: An increase or decrease   of 30% or more on serial   testing suggests a   clinically important change  Routine UA:    21-May-14 21:48, Urinalysis  Color (UA) Yellow  Clarity (UA) Clear  Glucose (UA) Negative  Bilirubin (UA) Negative  Ketones (UA) Trace  Specific Gravity (UA) 1.018  Blood (UA) Negative  pH (UA) 5.0  Protein (UA) 30 mg/dL  Nitrite (UA) Negative  Leukocyte Esterase (UA) Negative  Result(s) reported on 13 Nov 2012 at 10:21PM.  RBC (UA) 3 /HPF  WBC (UA) 1 /HPF  Bacteria (UA)   NONE SEEN  Epithelial Cells (UA)   NONE SEEN  Mucous (UA) PRESENT  Result(s) reported on 13 Nov 2012 at 10:21PM.  Routine Coag:    22-May-14 11:55, D-Dimer, Quantitative  D-Dimer, Quantitative 1.57  If the D-dimer test is being used to assist in the exclusion of DVT  and/or PE, note the following:  In various studies concerning the  D-dimer methodology (STA Liatest) in use by this laboratory, it has  been reported that with a cut-off value of 0.50 ug/mL FEU, the   negative predictive value regarding the exclusion of thrombosis is  within the 95-100% range.  In patients with high pre-test probability  of DVT/PE the results of the D-dimer test should be correlated with  other diagnostic and clinical assessment modalities."  Reference: [employer]., 2005.  Routine Hem:    21-May-14 21:48, Hemogram, Platelet Count  WBC (CBC) 6.7  RBC (CBC) 4.49  Hemoglobin (CBC) 13.5  Hematocrit (CBC) 39.2  Platelet Count (CBC) 117  Result(s) reported on 13 Nov 2012 at 10:22PM.  MCV 87  MCH 30.2  MCHC 34.6  RDW 12.8   RADIOLOGY:  Radiology Results: XRay:    21-May-14 22:05, Chest 1 View AP or PA  Chest 1 View AP or PA  REASON FOR EXAM:    Shorth of breath  COMMENTS:       PROCEDURE: DXR - DXR CHEST 1 VIEWAP OR PA  - Nov 13 2012 10:05PM     RESULT: Comparison: None.    Findings:  The heart is  normal in size. Prior median sternotomy and CABG. The lung   volumes are slightly diminished. No focal pulmonary opacities.    IMPRESSION:   No acute cardiopulmonary disease.    Dictation site: 2    Verified By: Gregor Hams, M.D., MD  [place]:    22-May-14 09:54, US Carotid Doppler Bilateral  US Carotid Doppler Bilateral  REASON FOR EXAM:    dizzy  COMMENTS:       PROCEDURE: [place]  - US CAROTID DOPPLER BILATERAL  - Nov 14 2012  9:54AM     RESULT: Comparison: None    Technique: Gray-scale, color Doppler, and spectral Doppler images were   obtained of the extracranial carotid artery systems and vertebral   arteries in the neck.    Findings:  Mild atherosclerotic plaque is demonstrated in the right carotid bulb and   internal carotid artery. By visual inspection, there is less than 50%   stenosis. The peak systolic velocities are not elevated. Atherosclerotic     plaque is demonstrated in the left carotid bulb and internal carotid   artery. By visual inspection, there is at least a 50% stenosis. The peak   systolic velocity in the left mid internal carotid artery is156 cm/sec.    The peak systolic velocity in the distal left internal carotid artery is   225cm/sec. This correlates with a 50-75% stenosis.    The vertebral arteries are patent bilaterally.    IMPRESSION:      1. Findings concerning for a 50-75% stenosis in the left internal carotid   artery. Further evaluation could be provided with CTA of the neck.  2. No evidence of hemodynamically significant stenosis in the right   extracranial carotid artery.  Dictation site: 2        Verified By: Gregor Hams, M.D., MD  LabUnknown:    21-May-14 22:05, Chest  1 View AP or PA  PACS Image    22-May-14 00:11, CT Head Without Contrast  PACS Image    22-May-14 09:54, US Carotid Doppler Bilateral  PACS Image  CT:    22-May-14 00:11, CT Head Without Contrast  CT Head Without Contrast  REASON FOR EXAM:    dizziness  headache  COMMENTS:       PROCEDURE: CT  - CT HEAD WITHOUT CONTRAST  - Nov 14 2012 12:11AM     RESULT: Axial noncontrast CT scanning was performed through the brain   with reconstructions at 5 mm intervals and slice thicknesses.    There is mild diffuse cerebral and cerebellar atrophy with compensatory   ventriculomegaly. There is no shift of the midline. Decreased density in   the deep white matter of both cerebral hemispheres consistent with   chronic small vessel ischemic type change. There is no evidence of an   acute intracranial hemorrhage nor of an evolving ischemic infarction.    At bone window settings the observed portions of the paranasal sinuses     and mastoid air cells are clear. There is no evidence of an acute skull   fracture. Irregularity of the skin surface over the nose is present   consistent with recent surgery.    IMPRESSION:   1. There is no evidence of an acute ischemic or hemorrhagic infarction  2. There is no intracranial mass effect or hydrocephalus.  3. There are white matter density changes consistent with chronic small   vessel ischemia.    A preliminary report was sent to the emergency department at the   conclusion of the study.     Dictation Site: 1   Dictation Site: 1        Verified By: DAVID A. Martinique, M.D., MD   ASSESSMENT AND PLAN:  Assessment/Admission Diagnosis dizziness moerate left ICA stenosis.  No right ICA stenosis.  Vertebrals patent Mild CKD   Plan [place] here shows left ICA stenosis 50-75% no significant right ICA stenosis and patent vertebral arteries bilaterally. This is very unlikely to be the cause of his symptoms and is well below the threshold for surgery, particularly in a 81 yo.  Will be happy to follow in office.  Would just use ASA and statin as medical therapy.     level 3   Electronic Signatures: Algernon Huxley (MD)  (Signed 22-May-14 14:24)  Authored: Chief Complaint and History, PAST MEDICAL/SURGICAL HISTORY,  ALLERGIES, HOME MEDICATIONS, Family and Social History, Review of Systems, Physical Exam, LABS, RADIOLOGY, Assessment and Plan   Last Updated: 22-May-14 14:24 by Algernon Huxley (MD)

## 2014-10-16 NOTE — Consult Note (Signed)
Brief Consult Note: Diagnosis: Vertigo/Near syncope/Weakness.   Patient was seen by consultant.   Consult note dictated.   Recommend further assessment or treatment.   Orders entered.   Discussed with Attending MD.   Comments: IMP Vertigo Near syncope Aortis Stenosis PVD Mod severe carotid stenosis Prostate Ca CRI . PLAN Agree with ECHO -. mod to severe Aortic stenosis Agree with vascular input No evidence of an acute cardiac event Rec ASA 81  mg for secondary prevntion Statin therapy also for secondary prevention I do not rec AoV rplacement at this point I do not rec cardiac cath Poor coumadin/Eliquis candidate I rec consevative medical therapy F/U Dr Ubaldo Glassing as outpt 23 wees.  Electronic Signatures: Lujean Amel D (MD)  (Signed 23-May-14 06:38)  Authored: Brief Consult Note   Last Updated: 23-May-14 06:38 by Yolonda Kida (MD)

## 2014-10-16 NOTE — Consult Note (Signed)
Brief Consult Note: Diagnosis: flank pain.   Comments: Was going to see patient, Dr Verdell Carmine on floor, stated he was discharging patient who could fu as indicated with Gi as outpatient, and that he was cancelling the consult.  Electronic Signatures: Stephens November H (NP)  (Signed 23-May-14 15:16)  Authored: Brief Consult Note   Last Updated: 23-May-14 15:16 by Theodore Demark (NP)

## 2014-10-16 NOTE — Discharge Summary (Signed)
PATIENT NAME:  Kevin Tyler, PURYEAR MR#:  [id number] DATE OF BIRTH:  [birth year]  DATE OF ADMISSION:  11/14/2012 DATE OF DISCHARGE:  11/15/2012  For a detailed note, please take a look at the history and physical done on admission by Dr. Margaretmary Eddy.   DIAGNOSES AT DISCHARGE: Is as follows:   1.  Presyncope. Likely related to poor oral intake.  2.  Generalized weakness secondary to poor oral intake.  3.  Pleuritic  chest pain, likely musculoskeletal. PE ruled out.  4.  Elevated troponin likely in the setting of acute renal failure.  5.  History of coronary artery disease, status post coronary artery bypass graft.  6.  Hypertension.  7.  Carotid artery stenosis.  8.  Benign prostatic hypertrophy.   DIET: The patient is being discharged on a low sodium, low fat diet.   ACTIVITY: As tolerated.   HOME HEALTH:  The patient is being discharged with home health physical therapy.   FOLLOW-UP:  Is with Dr. Clayborn Bigness next 2 to 3 days. Also follow up with Dr. Ezequiel Kayser in the next 2 to 3 days.   DISCHARGE MEDICATIONS: Simvastatin 20 mg daily, Rapaflo 8 mg daily, Prilosec 40 mg daily, aspirin 81 mg 2 tabs daily, Ocuvite 1 tab daily, Tylenol 1 tab at bedtime as needed, Atenolol 25 mg daily, lisinopril 2.5 mg daily.   CONSULTANTS DURING THE HOSPITAL COURSE: Dr. Clayborn Bigness from cardiology, Dr. Leotis Pain from vascular surgery.   PERTINENT STUDIES DONE DURING THE HOSPITAL COURSE: CT scan of the head done without contrast on admission showing no acute intracranial abnormality. No hemorrhage. No infarct.  A chest x-ray done on admission showing no acute cardiopulmonary disease. A lung VQ scan showed low probability for pulmonary embolism.   An ultrasound of the carotids showing findings concerning of 50% to 75% stenosis in the left internal carotid artery. Antegrade flow in both vertebrals.   A 2-dimensional echocardiogram done, showing ejection fraction to be normal at 50 % to 55%, mildly dilated left atrium,  mildly dilated right atrium, mild mitral valve regurgitation.   HOSPITAL COURSE: This is a 81 year old male with medical problems as mentioned above, presented to the hospital on 11/15/2011 to do a near syncopal episode and a mildly elevated troponin.  1.  Presyncope. The likely cause of this is probably dehydration and poor oral intake. The observed on telemetry and did not have any evidence of acute cardiac dysrhythmias.  His neurological work-up including a CT head was negative. He had a carotid duplex, which did show some mild carotid artery stenosis. The patient was seen by vascular surgery, who did not think that the patient was a candidate for any acute surgical evaluation or intervention, but to continue aspirin and statin for now. The patient was evaluated by physical therapy and ambulated. He did well and  is being arranged for home health physical therapy upon discharge.  2.  Generalized weakness.  This is secondary to poor oral intake and dehydration. This has improved with IV fluid hydration the patient's oral intake has improved since admission. The patient was evaluated by physical therapy and they recommend home health, PT services, which is being arranged for him on discharge.  3.  Elevated troponin. This is likely in the setting of acute renal failure. The patient's troponin only went up as high as 0.07. The patient was seen by cardiology, Dr. Clayborn Bigness, who did not think the patient needed any acute cardiac intervention. His echo did not show any evidence of  LV dysfunction. For now, I will continue aspirin and statin as mentioned.  4.  Carotid artery stenosis. This was noted to be on work-up for his presyncope. The patient noted to have a left-sided 50% to 75% acute carotid artery stenosis. A vascular surgery consult was obtained. The patient was seen by Dr. Lucky Cowboy, who did not think that the patient needed acute surgical intervention, but likely a medical management, as he had no neurologic  symptoms. For now,  he will continue aspirin and statin and follow up with vascular surgery as an outpatient if needed.  5.  Hyperlipidemia. The patient was maintained on his simvastatin and he will resume that.  6.  Hypertension. The patient's blood pressures remain labile. He was only on atenolol prior to coming in and therefore his lisinopril was added to his regimen. At present, he is being discharged on atenolol and lisinopril as stated. The patient is being discharged home with home health physical therapy services. The daughter and family are in agreement this plan.   CODE STATUS: The patient is a DO NOT INTUBATE/DO NOT RESUSCITATE.   TIME SPENT: 40 minutes.   ____________________________ Belia Heman. Verdell Carmine, MD vjs:cc D: 11/15/2012 16:35:29 ET T: 11/15/2012 19:55:51 ET JOB#: [id number]  cc: Belia Heman. Verdell Carmine, MD, <Dictator> Dwayne D. Clayborn Bigness, MD Christena Flake. Raechel Ache, MD Henreitta Leber MD ELECTRONICALLY SIGNED 12/01/2012 20:24

## 2015-02-03 DIAGNOSIS — I2510 Atherosclerotic heart disease of native coronary artery without angina pectoris: Secondary | ICD-10-CM | POA: Insufficient documentation

## 2015-02-11 DIAGNOSIS — H8110 Benign paroxysmal vertigo, unspecified ear: Secondary | ICD-10-CM | POA: Insufficient documentation

## 2015-07-29 DIAGNOSIS — H4311 Vitreous hemorrhage, right eye: Secondary | ICD-10-CM | POA: Insufficient documentation

## 2015-07-29 DIAGNOSIS — H353211 Exudative age-related macular degeneration, right eye, with active choroidal neovascularization: Secondary | ICD-10-CM | POA: Insufficient documentation

## 2015-07-29 DIAGNOSIS — H3561 Retinal hemorrhage, right eye: Secondary | ICD-10-CM | POA: Insufficient documentation

## 2015-07-29 DIAGNOSIS — H353124 Nonexudative age-related macular degeneration, left eye, advanced atrophic with subfoveal involvement: Secondary | ICD-10-CM | POA: Insufficient documentation

## 2015-09-13 DIAGNOSIS — Z961 Presence of intraocular lens: Secondary | ICD-10-CM | POA: Insufficient documentation

## 2017-10-18 ENCOUNTER — Other Ambulatory Visit: Payer: Self-pay

## 2017-10-18 ENCOUNTER — Emergency Department
Admission: EM | Admit: 2017-10-18 | Discharge: 2017-10-18 | Disposition: A | Discharge status: Home / Self Care | Payer: Medicare Other | Attending: Emergency Medicine | Admitting: Emergency Medicine

## 2017-10-18 ENCOUNTER — Encounter: Payer: Self-pay | Admitting: Emergency Medicine

## 2017-10-18 ENCOUNTER — Emergency Department: Payer: Medicare Other

## 2017-10-18 DIAGNOSIS — Z79899 Other long term (current) drug therapy: Secondary | ICD-10-CM | POA: Diagnosis not present

## 2017-10-18 DIAGNOSIS — Z951 Presence of aortocoronary bypass graft: Secondary | ICD-10-CM | POA: Diagnosis not present

## 2017-10-18 DIAGNOSIS — R079 Chest pain, unspecified: Secondary | ICD-10-CM | POA: Diagnosis present

## 2017-10-18 DIAGNOSIS — I209 Angina pectoris, unspecified: Secondary | ICD-10-CM | POA: Insufficient documentation

## 2017-10-18 DIAGNOSIS — I10 Essential (primary) hypertension: Secondary | ICD-10-CM | POA: Diagnosis not present

## 2017-10-18 HISTORY — DX: Essential (primary) hypertension: I10

## 2017-10-18 LAB — BASIC METABOLIC PANEL
ANION GAP: 6 (ref 5–15)
BUN: 24 mg/dL — AB (ref 6–20)
CHLORIDE: 106 mmol/L (ref 101–111)
CO2: 27 mmol/L (ref 22–32)
Calcium: 9 mg/dL (ref 8.9–10.3)
Creatinine, Ser: 1.25 mg/dL — ABNORMAL HIGH (ref 0.61–1.24)
GFR calc Af Amer: 54 mL/min — ABNORMAL LOW (ref >60)
GFR calc non Af Amer: 46 mL/min — ABNORMAL LOW (ref >60)
GLUCOSE: 112 mg/dL — AB (ref 65–99)
POTASSIUM: 4.1 mmol/L (ref 3.5–5.1)
Sodium: 139 mmol/L (ref 135–145)

## 2017-10-18 LAB — CBC WITH DIFFERENTIAL/PLATELET
Basophils Absolute: 0 10*3/uL (ref 0–0.1)
Basophils Relative: 1 %
EOS PCT: 3 %
Eosinophils Absolute: 0.2 10*3/uL (ref 0–0.7)
HEMATOCRIT: 38 % — AB (ref 40.0–52.0)
Hemoglobin: 12.9 g/dL — ABNORMAL LOW (ref 13.0–18.0)
LYMPHS ABS: 1 10*3/uL (ref 1.0–3.6)
LYMPHS PCT: 18 %
MCH: 30.2 pg (ref 26.0–34.0)
MCHC: 33.8 g/dL (ref 32.0–36.0)
MCV: 89.2 fL (ref 80.0–100.0)
MONO ABS: 0.5 10*3/uL (ref 0.2–1.0)
MONOS PCT: 9 %
NEUTROS ABS: 3.8 10*3/uL (ref 1.4–6.5)
Neutrophils Relative %: 69 %
PLATELETS: 122 10*3/uL — AB (ref 150–440)
RBC: 4.26 MIL/uL — ABNORMAL LOW (ref 4.40–5.90)
RDW: 13.3 % (ref 11.5–14.5)
WBC: 5.6 10*3/uL (ref 3.8–10.6)

## 2017-10-18 LAB — TROPONIN I

## 2017-10-18 MED ORDER — ASPIRIN 81 MG PO CHEW
324.0000 mg | CHEWABLE_TABLET | Freq: Once | ORAL | Status: DC
  Filled 2017-10-18: qty 4

## 2017-10-18 MED ORDER — ASPIRIN 81 MG PO CHEW: 324.0000 mg | CHEWABLE_TABLET | Freq: Once | ORAL | Status: DC

## 2017-10-18 NOTE — ED Notes (Signed)
Pt wants [place] to call Dr. Ubaldo Glassing before he takes the [place]. Will inform Dr. Joni Fears.

## 2017-10-18 NOTE — ED Notes (Signed)
Pt helped to bathroom at this time.

## 2017-10-18 NOTE — ED Provider Notes (Addendum)
[facility] Emergency Department Provider Note  ____________________________________________  Time seen: Approximately 10:20 AM  I have reviewed the triage vital signs and the nursing notes.   HISTORY  Chief Complaint Chest Pain    HPI Kevin Tyler is a 84 y.o. male with a history of CAD status post CABG, hypertension, who is in his usual state of health when he had a sudden onset of central chest pain at 9:00 AM today. described as a crushing pressure. Nonradiating. severe intensity. No aggravating factors. He laid down on the couch and put his feet up and after 20 minutes the pain resolved. Denies shortness of breath vomiting or diaphoresis.  patient does note that for many years he has had angina, where he will get chest pain with exertion that goes away with rest. He reports that this is not been changing, not been accelerating. His current symptoms do feel compatible without although somewhat more intense.   Past Medical History:  Diagnosis Date  . Hypertension      Patient Active Problem List   Diagnosis Date Noted  . History of secondary intraocular lens implant 09/13/2015  . Exudative age-related macular degeneration, right eye, with active choroidal neovascularization ([place]) 07/29/2015  . Nonexudative age-related macular degeneration, left eye, advanced atrophic with subfoveal involvement 07/29/2015  . Subretinal hemorrhage of right eye 07/29/2015  . Vitreous hemorrhage of right eye ([place]) 07/29/2015  . BPPV (benign paroxysmal positional vertigo) 02/11/2015  . Chronic coronary artery disease 02/03/2015  . Sciatica of right side 05/12/2014  . Anemia, unspecified 01/05/2014  . Carotid artery disease ([place]) 01/05/2014  . History of aortic stenosis 01/05/2014  . Hyperlipidemia, unspecified 01/05/2014  . Hypertension 01/05/2014  . Myocardial infarction ([place]) 01/05/2014  . Renal insufficiency 01/05/2014     past surgical history of coronary  artery bypass graft   Prior to Admission medications   Medication Sig Start Date End Date Taking? Authorizing Provider  atenolol (TENORMIN) 50 MG tablet Take 25 mg by mouth daily. 02/05/17  Yes [provider]  lisinopril (PRINIVIL,ZESTRIL) 2.5 MG tablet Take 1 tablet by mouth daily. 08/06/17  Yes [provider]  Multiple Vitamins-Minerals (PRESERVISION AREDS 2+MULTI VIT) CAPS Take 1 capsule by mouth 2 (two) times daily.   Yes [provider]  omeprazole (PRILOSEC) 20 MG capsule Take 1 capsule by mouth daily. 09/25/17  Yes [provider]  simvastatin (ZOCOR) 20 MG tablet Take 1 tablet by mouth every evening. 09/25/17  Yes [provider]  tamsulosin (FLOMAX) 0.4 MG CAPS capsule Take 1 capsule by mouth. 30 minutes after the same daily meal 02/22/17  Yes [provider]     Allergies Patient has no known allergies.   History reviewed. No pertinent family history.  Social History Social History   Tobacco Use  . Smoking status: Never Smoker  Substance Use Topics  . Alcohol use: Not Currently  . Drug use: Never    Review of Systems  Constitutional:   No fever or chills.  ENT:   No sore throat. No rhinorrhea. Cardiovascular:   positive as above chest pain without syncope. Respiratory:   No dyspnea or cough. Gastrointestinal:   Negative for abdominal pain, vomiting and diarrhea.  Musculoskeletal:   Negative for focal pain or swelling All other systems reviewed and are negative except as documented above in ROS and HPI.  ____________________________________________   PHYSICAL EXAM:  VITAL SIGNS: ED Triage Vitals  Enc Vitals Group     BP 10/18/17 1015 Marland Kitchen)  161/62     Pulse Rate 10/18/17 1015 70     Resp 10/18/17 1015 (!) 22     Temp 10/18/17 1015 97.7 F (36.5 C)     Temp Source 10/18/17 1015 Oral     SpO2 10/18/17 1015 100 %     Weight 10/18/17 1016 145 lb (65.8 kg)     Height 10/18/17 1016 5\' 7"  (1.702 m)     Head  Circumference --      Peak Flow --      Pain Score 10/18/17 1016 0     Pain Loc --      Pain Edu? --      Excl. in [place]? --     Vital signs reviewed, nursing assessments reviewed.   Constitutional:   Alert and oriented. Well appearing and in no distress. Eyes:   Conjunctivae are normal. EOMI. PERRL. ENT      Head:   Normocephalic and atraumatic.      Nose:   No congestion/rhinnorhea.       Mouth/Throat:   MMM, no pharyngeal erythema. No peritonsillar mass.       Neck:   No meningismus. Full ROM. Hematological/Lymphatic/Immunilogical:   No cervical lymphadenopathy. Cardiovascular:   RRR. Symmetric bilateral radial and DP pulses. loud systolic AS murmur Respiratory:   Normal respiratory effort without tachypnea/retractions. Breath sounds are clear and equal bilaterally. No wheezes/rales/rhonchi. Gastrointestinal:   Soft and nontender. Non distended. There is no CVA tenderness.  No rebound, rigidity, or guarding. Genitourinary:   deferred Musculoskeletal:   Normal range of motion in all extremities. No joint effusions.  No lower extremity tenderness.  No edema. Neurologic:   Normal speech and language.  Motor grossly intact. No acute focal neurologic deficits are appreciated.  Skin:    Skin is warm, dry and intact. No rash noted.  No petechiae, purpura, or bullae.  ____________________________________________    LABS (pertinent positives/negatives) (all labs ordered are listed, but only abnormal results are displayed) Labs Reviewed  BASIC METABOLIC PANEL - Abnormal; Notable for the following components:      Result Value   Glucose, Bld 112 (*)    BUN 24 (*)    Creatinine, Ser 1.25 (*)    GFR calc non Af Amer 46 (*)    GFR calc Af Amer 54 (*)    All other components within normal limits  CBC WITH DIFFERENTIAL/PLATELET - Abnormal; Notable for the following components:   RBC 4.26 (*)    Hemoglobin 12.9 (*)    HCT 38.0 (*)    Platelets 122 (*)    All other components within  normal limits  TROPONIN I  TROPONIN I   ____________________________________________   EKG  interpreted by me Sinus rhythm rate of 70, normal axis and intervals. Normal QRS. Slight ST depression in lateral leads. No ST elevation. Normal T waves. ST depression appears to be new compared to May 2014.  repeat EKG performed at 10:59 AM, interpreted by me, sinus rhythm rate of 62, normal axis intervals QRS. No interval change from first EKG today. ____________________________________________    RADIOLOGY  Dg Chest Portable 1 View  Result Date: 10/18/2017 CLINICAL DATA:  Chest pain EXAM: PORTABLE CHEST 1 VIEW COMPARISON:  Nov 13, 2012 FINDINGS: There is no edema or consolidation. Heart is upper normal in size with pulmonary vascularity within normal limits. No adenopathy. Patient is status post internal mammary bypass grafting. There is aortic atherosclerosis. No evident bone lesions. IMPRESSION: No edema or consolidation. Stable  cardiac silhouette. There is aortic atherosclerosis. Aortic Atherosclerosis (ICD10-I70.0). Electronically Signed   By: Lowella Grip III M.D.   On: 10/18/2017 10:44    ____________________________________________   PROCEDURES Procedures  ____________________________________________  DIFFERENTIAL DIAGNOSIS   non-STEMI, GERD  CLINICAL IMPRESSION / ASSESSMENT AND PLAN / ED COURSE  Pertinent labs & imaging results that were available during my care of the patient were reviewed by me and considered in my medical decision making (see chart for details).    patient currently well appearing and not in acute distress. Unremarkable vital signs. Presents after a 20 minute episode of concerning chest pain. Patient has a significant medical history for CAD status post CABG and other CAD risk factors. EKG shows some slight evidence of ischemic change. We'll check labs, chest x-ray, serial EKG. Patient will likely need hospitalization for further cardiac workup. His  cardiologist is Dr. Ubaldo Glassing.  Clinical Course as of Oct 19 1314  Thu Oct 18, 2017  1229 Still chest pain free. Labs unremarkable.  Discussed clinical scenario with patient, recommended admission. Engaged in shared MDM with pt, who would rather go home and f/u with PCP within the next week. I think this is reasonable, especially in light of his clear h/o angina which is likely what happened today. Will check a second trop for further risk stratification, DC home if negative.    [PS]    Clinical Course User Index [PS] Carrie Mew, MD      ----------------------------------------- 1:16 PM on 10/18/2017 -----------------------------------------  Troponin negative. Patient had baseline. Stable for discharge home. ____________________________________________   FINAL CLINICAL IMPRESSION(S) / ED DIAGNOSES    Final diagnoses:  Angina pectoris ([place])  nonspecific chest pain   ED Discharge Orders    None      Portions of this note were generated with dragon dictation software. Dictation errors may occur despite best attempts at proofreading.    Carrie Mew, MD 10/18/17 1316    Carrie Mew, MD 10/18/17 1328

## 2017-10-18 NOTE — ED Triage Notes (Signed)
Pt from home came to ED via [facility] with complaints of chest pain. He was home cooking breakfast and developed crushing chest pain across his chest, Denies SOB, diaphoresis,N/V or radiation. He laid down on the couch, elevated his leg and was pain free by the time ems arrived.

## 2017-10-18 NOTE — ED Notes (Signed)
Pt taken to the lobby waiting for taxi. VSS. NAD. Discharge instructions, RX, follow up discussed. All questions addressed.

## 2017-10-18 NOTE — Discharge Instructions (Signed)
Continue taking all your home medications as prescribed and follow up with your doctors within 1 week.

## 2018-03-04 ENCOUNTER — Inpatient Hospital Stay: Payer: Medicare Other

## 2018-03-04 ENCOUNTER — Encounter: Payer: Self-pay | Admitting: Radiology

## 2018-03-04 ENCOUNTER — Emergency Department: Payer: Medicare Other

## 2018-03-04 ENCOUNTER — Other Ambulatory Visit: Payer: Self-pay

## 2018-03-04 ENCOUNTER — Inpatient Hospital Stay
Admission: EM | Admit: 2018-03-04 | Discharge: 2018-03-06 | DRG: 356 | Disposition: A | Discharge status: Home Health | Payer: Medicare Other | Attending: Internal Medicine | Admitting: Internal Medicine

## 2018-03-04 DIAGNOSIS — Z8546 Personal history of malignant neoplasm of prostate: Secondary | ICD-10-CM

## 2018-03-04 DIAGNOSIS — I959 Hypotension, unspecified: Secondary | ICD-10-CM | POA: Diagnosis not present

## 2018-03-04 DIAGNOSIS — K921 Melena: Secondary | ICD-10-CM | POA: Diagnosis not present

## 2018-03-04 DIAGNOSIS — R52 Pain, unspecified: Secondary | ICD-10-CM

## 2018-03-04 DIAGNOSIS — K922 Gastrointestinal hemorrhage, unspecified: Secondary | ICD-10-CM | POA: Diagnosis present

## 2018-03-04 DIAGNOSIS — I350 Nonrheumatic aortic (valve) stenosis: Secondary | ICD-10-CM | POA: Diagnosis present

## 2018-03-04 DIAGNOSIS — R578 Other shock: Secondary | ICD-10-CM | POA: Diagnosis present

## 2018-03-04 DIAGNOSIS — Z951 Presence of aortocoronary bypass graft: Secondary | ICD-10-CM | POA: Diagnosis not present

## 2018-03-04 DIAGNOSIS — K862 Cyst of pancreas: Secondary | ICD-10-CM | POA: Diagnosis not present

## 2018-03-04 DIAGNOSIS — E861 Hypovolemia: Secondary | ICD-10-CM | POA: Diagnosis not present

## 2018-03-04 DIAGNOSIS — I2510 Atherosclerotic heart disease of native coronary artery without angina pectoris: Secondary | ICD-10-CM | POA: Diagnosis present

## 2018-03-04 DIAGNOSIS — N179 Acute kidney failure, unspecified: Secondary | ICD-10-CM | POA: Diagnosis not present

## 2018-03-04 DIAGNOSIS — K5731 Diverticulosis of large intestine without perforation or abscess with bleeding: Principal | ICD-10-CM | POA: Diagnosis present

## 2018-03-04 DIAGNOSIS — I252 Old myocardial infarction: Secondary | ICD-10-CM

## 2018-03-04 DIAGNOSIS — R402143 Coma scale, eyes open, spontaneous, at hospital admission: Secondary | ICD-10-CM | POA: Diagnosis not present

## 2018-03-04 DIAGNOSIS — Z23 Encounter for immunization: Secondary | ICD-10-CM | POA: Diagnosis not present

## 2018-03-04 DIAGNOSIS — M2550 Pain in unspecified joint: Secondary | ICD-10-CM | POA: Diagnosis not present

## 2018-03-04 DIAGNOSIS — D62 Acute posthemorrhagic anemia: Secondary | ICD-10-CM | POA: Diagnosis present

## 2018-03-04 DIAGNOSIS — K449 Diaphragmatic hernia without obstruction or gangrene: Secondary | ICD-10-CM | POA: Diagnosis not present

## 2018-03-04 DIAGNOSIS — Z66 Do not resuscitate: Secondary | ICD-10-CM | POA: Diagnosis present

## 2018-03-04 DIAGNOSIS — M2540 Effusion, unspecified joint: Secondary | ICD-10-CM | POA: Diagnosis not present

## 2018-03-04 DIAGNOSIS — Z9079 Acquired absence of other genital organ(s): Secondary | ICD-10-CM

## 2018-03-04 DIAGNOSIS — R402363 Coma scale, best motor response, obeys commands, at hospital admission: Secondary | ICD-10-CM | POA: Diagnosis present

## 2018-03-04 DIAGNOSIS — K625 Hemorrhage of anus and rectum: Secondary | ICD-10-CM

## 2018-03-04 DIAGNOSIS — K8050 Calculus of bile duct without cholangitis or cholecystitis without obstruction: Secondary | ICD-10-CM | POA: Diagnosis present

## 2018-03-04 DIAGNOSIS — J449 Chronic obstructive pulmonary disease, unspecified: Secondary | ICD-10-CM | POA: Diagnosis not present

## 2018-03-04 DIAGNOSIS — Z79899 Other long term (current) drug therapy: Secondary | ICD-10-CM

## 2018-03-04 DIAGNOSIS — I10 Essential (primary) hypertension: Secondary | ICD-10-CM | POA: Diagnosis present

## 2018-03-04 DIAGNOSIS — K648 Other hemorrhoids: Secondary | ICD-10-CM | POA: Diagnosis not present

## 2018-03-04 DIAGNOSIS — Z886 Allergy status to analgesic agent status: Secondary | ICD-10-CM

## 2018-03-04 DIAGNOSIS — R402253 Coma scale, best verbal response, oriented, at hospital admission: Secondary | ICD-10-CM | POA: Diagnosis present

## 2018-03-04 DIAGNOSIS — K219 Gastro-esophageal reflux disease without esophagitis: Secondary | ICD-10-CM | POA: Diagnosis present

## 2018-03-04 DIAGNOSIS — Z923 Personal history of irradiation: Secondary | ICD-10-CM

## 2018-03-04 DIAGNOSIS — N400 Enlarged prostate without lower urinary tract symptoms: Secondary | ICD-10-CM | POA: Diagnosis present

## 2018-03-04 HISTORY — DX: Basal cell carcinoma of skin of other part of trunk: C44.519

## 2018-03-04 HISTORY — DX: Malignant neoplasm of prostate: C61

## 2018-03-04 HISTORY — DX: Atherosclerotic heart disease of native coronary artery without angina pectoris: I25.10

## 2018-03-04 HISTORY — DX: Acute myocardial infarction, unspecified: I21.9

## 2018-03-04 HISTORY — DX: Unspecified macular degeneration: H35.30

## 2018-03-04 HISTORY — DX: Nonrheumatic aortic (valve) stenosis: I35.0

## 2018-03-04 HISTORY — DX: Chronic obstructive pulmonary disease, unspecified: J44.9

## 2018-03-04 HISTORY — DX: Retinal hemorrhage, unspecified eye: H35.60

## 2018-03-04 HISTORY — DX: Gastro-esophageal reflux disease without esophagitis: K21.9

## 2018-03-04 LAB — HEMOGLOBIN AND HEMATOCRIT, BLOOD
HCT: 31.1 % — ABNORMAL LOW (ref 40.0–52.0)
HEMOGLOBIN: 11 g/dL — AB (ref 13.0–18.0)

## 2018-03-04 LAB — COMPREHENSIVE METABOLIC PANEL
ALBUMIN: 3.2 g/dL — AB (ref 3.5–5.0)
ALK PHOS: 67 U/L (ref 38–126)
ALT: 14 U/L (ref 0–44)
AST: 16 U/L (ref 15–41)
Anion gap: 8 (ref 5–15)
BILIRUBIN TOTAL: 0.8 mg/dL (ref 0.3–1.2)
BUN: 34 mg/dL — AB (ref 8–23)
CALCIUM: 8.3 mg/dL — AB (ref 8.9–10.3)
CO2: 21 mmol/L — ABNORMAL LOW (ref 22–32)
CREATININE: 1.47 mg/dL — AB (ref 0.61–1.24)
Chloride: 109 mmol/L (ref 98–111)
GFR calc Af Amer: 44 mL/min — ABNORMAL LOW (ref >60)
GFR, EST NON AFRICAN AMERICAN: 38 mL/min — AB (ref >60)
GLUCOSE: 186 mg/dL — AB (ref 70–99)
Potassium: 4.6 mmol/L (ref 3.5–5.1)
Sodium: 138 mmol/L (ref 135–145)
TOTAL PROTEIN: 5.6 g/dL — AB (ref 6.5–8.1)

## 2018-03-04 LAB — CBC WITH DIFFERENTIAL/PLATELET
BASOS ABS: 0 10*3/uL (ref 0–0.1)
BASOS PCT: 1 %
EOS ABS: 0.2 10*3/uL (ref 0–0.7)
EOS PCT: 2 %
HCT: 25.7 % — ABNORMAL LOW (ref 40.0–52.0)
Hemoglobin: 8.9 g/dL — ABNORMAL LOW (ref 13.0–18.0)
Lymphocytes Relative: 19 %
Lymphs Abs: 1.7 10*3/uL (ref 1.0–3.6)
MCH: 30.9 pg (ref 26.0–34.0)
MCHC: 34.6 g/dL (ref 32.0–36.0)
MCV: 89.4 fL (ref 80.0–100.0)
MONO ABS: 0.6 10*3/uL (ref 0.2–1.0)
Monocytes Relative: 7 %
Neutro Abs: 6.2 10*3/uL (ref 1.4–6.5)
Neutrophils Relative %: 71 %
PLATELETS: 125 10*3/uL — AB (ref 150–440)
RBC: 2.88 MIL/uL — AB (ref 4.40–5.90)
RDW: 13 % (ref 11.5–14.5)
WBC: 8.8 10*3/uL (ref 3.8–10.6)

## 2018-03-04 LAB — HEMOGLOBIN: Hemoglobin: 10.8 g/dL — ABNORMAL LOW (ref 13.0–18.0)

## 2018-03-04 LAB — TROPONIN I

## 2018-03-04 LAB — LACTIC ACID, PLASMA
Lactic Acid, Venous: 2.2 mmol/L (ref 0.5–1.9)
Lactic Acid, Venous: 1.5 mmol/L (ref 0.5–1.9)

## 2018-03-04 LAB — IRON AND TIBC
IRON: 48 ug/dL (ref 45–182)
Saturation Ratios: 18 % (ref 17.9–39.5)
TIBC: 268 ug/dL (ref 250–450)
UIBC: 220 ug/dL

## 2018-03-04 LAB — MRSA PCR SCREENING: MRSA by PCR: NEGATIVE

## 2018-03-04 LAB — GLUCOSE, CAPILLARY: GLUCOSE-CAPILLARY: 103 mg/dL — AB (ref 70–99)

## 2018-03-04 LAB — PREPARE RBC (CROSSMATCH)

## 2018-03-04 LAB — VITAMIN B12: VITAMIN B 12: 195 pg/mL (ref 180–914)

## 2018-03-04 LAB — ABO/RH: ABO/RH(D): O POS

## 2018-03-04 LAB — FERRITIN: Ferritin: 50 ng/mL (ref 24–336)

## 2018-03-04 LAB — FOLATE: Folate: 60.4 ng/mL (ref >5.9)

## 2018-03-04 MED ORDER — SODIUM CHLORIDE 0.9 % IV SOLN
INTRAVENOUS | Status: DC
  Administered 2018-03-04 – 2018-03-05 (×3): via INTRAVENOUS

## 2018-03-04 MED ORDER — ONDANSETRON HCL 4 MG/2ML IJ SOLN: 4.0000 mg | Freq: Four times a day (QID) | INTRAMUSCULAR | Status: DC | PRN

## 2018-03-04 MED ORDER — PEG 3350-KCL-NA BICARB-NACL 420 G PO SOLR
4000.0000 mL | Freq: Once | ORAL | Status: AC
  Administered 2018-03-04: 4000 mL via ORAL
  Filled 2018-03-04: qty 4000

## 2018-03-04 MED ORDER — INFLUENZA VAC SPLIT HIGH-DOSE 0.5 ML IM SUSY
0.5000 mL | PREFILLED_SYRINGE | INTRAMUSCULAR | Status: AC
  Administered 2018-03-06: 0.5 mL via INTRAMUSCULAR
  Filled 2018-03-04 (×2): qty 0.5

## 2018-03-04 MED ORDER — OXYCODONE HCL 5 MG PO TABS: 5.0000 mg | ORAL_TABLET | ORAL | Status: DC | PRN

## 2018-03-04 MED ORDER — ACETAMINOPHEN 650 MG RE SUPP: 650.0000 mg | Freq: Four times a day (QID) | RECTAL | Status: DC | PRN

## 2018-03-04 MED ORDER — ACETAMINOPHEN 325 MG PO TABS: 650.0000 mg | ORAL_TABLET | Freq: Four times a day (QID) | ORAL | Status: DC | PRN

## 2018-03-04 MED ORDER — SODIUM CHLORIDE 0.9 % IV SOLN
10.0000 mL/h | Freq: Once | INTRAVENOUS | Status: AC
  Administered 2018-03-04: 50 mL/h via INTRAVENOUS

## 2018-03-04 MED ORDER — ONDANSETRON HCL 4 MG PO TABS: 4.0000 mg | ORAL_TABLET | Freq: Four times a day (QID) | ORAL | Status: DC | PRN

## 2018-03-04 MED ORDER — SODIUM CHLORIDE 0.9 % IV SOLN
INTRAVENOUS | Status: DC
  Administered 2018-03-06: 07:00:00 via INTRAVENOUS

## 2018-03-04 MED ORDER — POLYETHYLENE GLYCOL 3350 17 G PO PACK: 17.0000 g | PACK | Freq: Every day | ORAL | Status: DC | PRN

## 2018-03-04 MED ORDER — IOPAMIDOL (ISOVUE-300) INJECTION 61%
75.0000 mL | Freq: Once | INTRAVENOUS | Status: AC | PRN
  Administered 2018-03-04: 75 mL via INTRAVENOUS

## 2018-03-04 MED ORDER — PANTOPRAZOLE SODIUM 40 MG IV SOLR
40.0000 mg | Freq: Two times a day (BID) | INTRAVENOUS | Status: DC
  Administered 2018-03-04 – 2018-03-06 (×4): 40 mg via INTRAVENOUS
  Filled 2018-03-04 (×4): qty 40

## 2018-03-04 MED ORDER — PANTOPRAZOLE SODIUM 40 MG IV SOLR
40.0000 mg | Freq: Once | INTRAVENOUS | Status: AC
  Administered 2018-03-04: 40 mg via INTRAVENOUS
  Filled 2018-03-04: qty 40

## 2018-03-04 MED ORDER — TAMSULOSIN HCL 0.4 MG PO CAPS
0.4000 mg | ORAL_CAPSULE | Freq: Every day | ORAL | Status: DC
  Administered 2018-03-04: 0.4 mg via ORAL
  Filled 2018-03-04: qty 1

## 2018-03-04 NOTE — ED Triage Notes (Addendum)
EMS pt from home with c/o red to black bloody stool several times since last evening; pt denies pain in abd; skin pink; pt adds he has a headache that's "about to make me pass out"

## 2018-03-04 NOTE — Progress Notes (Signed)
Pt being transferred to room 117. Report called to Andee Poles, RN. Pt and belongings transferred to room 117 without incident.

## 2018-03-04 NOTE — ED Notes (Signed)
p52 100% bp 113 46

## 2018-03-04 NOTE — ED Notes (Signed)
Pt daughter notified pt is in ICU.

## 2018-03-04 NOTE — ED Provider Notes (Signed)
[facility] Emergency Department Provider Note  ____________________________________________  Time seen: Approximately 7:42 AM  I have reviewed the triage vital signs and the nursing notes.   HISTORY  Chief Complaint Rectal Bleeding    HPI WYLAN GENTZLER is a 84 y.o. male with a history of COPD hypertension aortic stenosis and prostate cancer who reports multiple bowel movements of maroon blood that started tonight.  Reports having a history of rectal bleeding that he attributes to radiation seeds that treated his prostate cancer about 20 years ago but denies any history of peptic ulcer disease.  He has been taking NSAIDs for the past several days due to his arthritis pain.  Does not take blood thinners.  With this he is having generalized weakness and dizziness and feels cold.  Symptoms are constant, no aggravating or alleviating factors.  he had generalized abdominal pain earlier which is now resolved.    Past Medical History:  Diagnosis Date  . Aortic stenosis   . Basal cell carcinoma (BCC) of upper back   . COPD (chronic obstructive pulmonary disease) ([place])   . Coronary artery disease   . GERD (gastroesophageal reflux disease)   . Hypertension   . Macular degeneration   . Myocardial infarct ([place])   . Prostate cancer ([place])   . Retinal hemorrhage      Patient Active Problem List   Diagnosis Date Noted  . History of secondary intraocular lens implant 09/13/2015  . Exudative age-related macular degeneration, right eye, with active choroidal neovascularization ([place]) 07/29/2015  . Nonexudative age-related macular degeneration, left eye, advanced atrophic with subfoveal involvement 07/29/2015  . Subretinal hemorrhage of right eye 07/29/2015  . Vitreous hemorrhage of right eye ([place]) 07/29/2015  . BPPV (benign paroxysmal positional vertigo) 02/11/2015  . Chronic coronary artery disease 02/03/2015  . Sciatica of right side 05/12/2014  . Anemia,  unspecified 01/05/2014  . Carotid artery disease ([place]) 01/05/2014  . History of aortic stenosis 01/05/2014  . Hyperlipidemia, unspecified 01/05/2014  . Hypertension 01/05/2014  . Myocardial infarction ([place]) 01/05/2014  . Renal insufficiency 01/05/2014     Past Surgical History:  Procedure Laterality Date  . CATARACT EXTRACTION    . CATARACT EXTRACTION W/ INTRAOCULAR LENS IMPLANT    . CORONARY ARTERY BYPASS GRAFT    . EYE SURGERY    . PROSTATECTOMY    . PROSTATECTOMY       Prior to Admission medications   Medication Sig Start Date End Date Taking? Authorizing Provider  atenolol (TENORMIN) 25 MG tablet Take 25 mg by mouth daily.  02/05/17  Yes [provider]  lisinopril (PRINIVIL,ZESTRIL) 2.5 MG tablet Take 1 tablet by mouth daily. 08/06/17  Yes [provider]  Multiple Vitamins-Minerals (PRESERVISION AREDS 2+MULTI VIT) CAPS Take 1 capsule by mouth 2 (two) times daily.   Yes [provider]  omeprazole (PRILOSEC) 20 MG capsule Take 1 capsule by mouth daily. 09/25/17  Yes [provider]  simvastatin (ZOCOR) 20 MG tablet Take 1 tablet by mouth every evening. 09/25/17  Yes [provider]  tamsulosin (FLOMAX) 0.4 MG CAPS capsule Take 1 capsule by mouth. 30 minutes after the same daily meal 02/22/17  Yes [provider]     Allergies Aspirin   History reviewed. No pertinent family history.  Social History Social History   Tobacco Use  . Smoking status: Never Smoker  . Smokeless tobacco: Never Used  Substance Use Topics  . Alcohol use: Not Currently  . Drug use: Never  Review of Systems  Constitutional:   No fever or chills.  Cardiovascular:   No chest pain or syncope. Respiratory:   No dyspnea or cough. Gastrointestinal: Positive for crampy abdominal pain, positive GI bleed.  Musculoskeletal:   Negative for focal pain or swelling All other systems reviewed and are negative except as documented above in ROS and  HPI.  ____________________________________________   PHYSICAL EXAM:  VITAL SIGNS: ED Triage Vitals  Enc Vitals Group     BP 03/04/18 0604 (!) 97/33     Pulse Rate 03/04/18 0604 (!) 52     Resp 03/04/18 0604 18     Temp 03/04/18 0604 (!) 97.5 F (36.4 C)     Temp Source 03/04/18 0604 Oral     SpO2 03/04/18 0604 100 %     Weight 03/04/18 0609 143 lb (64.9 kg)     Height 03/04/18 0609 5\' 7"  (1.702 m)     Head Circumference --      Peak Flow --      Pain Score 03/04/18 0607 1     Pain Loc --      Pain Edu? --      Excl. in [place]? --     Vital signs reviewed, nursing assessments reviewed.   Constitutional:   Alert and oriented. Non-toxic appearance.  Strong smell of melena Eyes:   Conjunctivae are pale. EOMI. PERRL. ENT      Head:   Normocephalic and atraumatic.      Nose:   No congestion/rhinnorhea.       Mouth/Throat:   MMM, no pharyngeal erythema. No peritonsillar mass.       Neck:   No meningismus. Full ROM. Hematological/Lymphatic/Immunilogical:   No cervical lymphadenopathy. Cardiovascular:   Bradycardia heart rate 50. Symmetric bilateral radial and DP pulses.  No murmurs. Cap refill less than 2 seconds. Respiratory:   Normal respiratory effort without tachypnea/retractions. Breath sounds are clear and equal bilaterally. No wheezes/rales/rhonchi. Gastrointestinal:   Soft and nontender. Non distended. There is no CVA tenderness.  No rebound, rigidity, or guarding.  Rectal exam shows melanotic stool. heme Occult positive Musculoskeletal:   Normal range of motion in all extremities. No joint effusions.  No lower extremity tenderness.  No edema. Neurologic:   Normal speech and language.  Motor grossly intact. No acute focal neurologic deficits are appreciated.  Skin:    Skin is warm, dry and intact. No rash noted.  No petechiae, purpura, or bullae.  ____________________________________________    LABS (pertinent positives/negatives) (all labs ordered are listed, but only  abnormal results are displayed) Labs Reviewed  COMPREHENSIVE METABOLIC PANEL - Abnormal; Notable for the following components:      Result Value   CO2 21 (*)    Glucose, Bld 186 (*)    BUN 34 (*)    Creatinine, Ser 1.47 (*)    Calcium 8.3 (*)    Total Protein 5.6 (*)    Albumin 3.2 (*)    GFR calc non Af Amer 38 (*)    GFR calc Af Amer 44 (*)    All other components within normal limits  CBC WITH DIFFERENTIAL/PLATELET - Abnormal; Notable for the following components:   RBC 2.88 (*)    Hemoglobin 8.9 (*)    HCT 25.7 (*)    Platelets 125 (*)    All other components within normal limits  TROPONIN I  LACTIC ACID, PLASMA  LACTIC ACID, PLASMA  TYPE AND SCREEN  PREPARE RBC (CROSSMATCH)  ABO/RH  ____________________________________________   EKG Interpreted by me Sinus bradycardia rate of 51, normal axis, normal intervals.  Normal  ST segments.  Voltage criteria for LVH with associated repolarization abnormality in the high lateral leads.  No acute ischemic changes.   ____________________________________________    RADIOLOGY  No results found.  ____________________________________________   PROCEDURES .Critical Care Performed by: Carrie Mew, MD Authorized by: Carrie Mew, MD   Critical care provider statement:    Critical care time (minutes):  35   Critical care time was exclusive of:  Separately billable procedures and treating other patients   Critical care was necessary to treat or prevent imminent or life-threatening deterioration of the following conditions:  Circulatory failure and shock   Critical care was time spent personally by me on the following activities:  Development of treatment plan with patient or surrogate, discussions with consultants, evaluation of patient's response to treatment, examination of patient, obtaining history from patient or surrogate, ordering and performing treatments and interventions, ordering and review of laboratory  studies, ordering and review of radiographic studies, pulse oximetry, re-evaluation of patient's condition and review of old charts    ____________________________________________    CLINICAL IMPRESSION / [place] / ED COURSE  Pertinent labs & imaging results that were available during my care of the patient were reviewed by me and considered in my medical decision making (see chart for details).    Patient presents with melanotic stool, found to have evidence of shock with hypotension and precipitous drop of his hemoglobin from a baseline of 12.9 to 8.9 today.  He is feeling weak and cold and dizzy.  He is bradycardic due to his beta-blocker use I think which is impeding necessary compensation.  Not on blood thinners, but likely still bleeding so I have initiated blood transfusion which the patient agrees with.  Discussed with gastroenterology who agrees with PPI and transfusion.  Discussed with hospitalist for admission.      ____________________________________________   FINAL CLINICAL IMPRESSION(S) / ED DIAGNOSES    Final diagnoses:  Acute GI bleeding  Hemorrhagic shock [facility])     ED Discharge Orders    None      Portions of this note were generated with dragon dictation software. Dictation errors may occur despite best attempts at proofreading.    Carrie Mew, MD 03/04/18 [phone]

## 2018-03-04 NOTE — ED Notes (Signed)
Pt had moderate amount mushy maroon colored stool; red blood noted on wipes when cleaning his bottom;

## 2018-03-04 NOTE — Consult Note (Signed)
Reason for Consult: gastrointestinal bleeding Referring Physician: Dr. Collene Mares is an 84 y.o. male.  HPI: [name] is a pleasant 84 year old gentleman with a past medical history remarkable for COPD, aortic stenosis, gastroesophageal reflux disease, hypertension, coronary artery disease, prior history of prostate cancer treated with radiation seed implantation, lower intestinal bleeding, status post cautery in the past, presented with episode of blood in bowel movements along with orthostasis and near syncope. He states he has had multiple bowel movements approximately 7 with maroon blood that began last evening He does have a history of NSAID use and also states he developed lower gastrointestinal bleeding after his prostate radiation which required cautery. Being admitted to the intensive care unit, receiving 2 units of blood pending gastroenterology consultation.  Past Medical History:  Diagnosis Date  . Aortic stenosis   . Basal cell carcinoma (BCC) of upper back   . COPD (chronic obstructive pulmonary disease) ([place])   . Coronary artery disease   . GERD (gastroesophageal reflux disease)   . Hypertension   . Macular degeneration   . Myocardial infarct ([place])   . Prostate cancer ([place])   . Retinal hemorrhage     Past Surgical History:  Procedure Laterality Date  . CATARACT EXTRACTION    . CATARACT EXTRACTION W/ INTRAOCULAR LENS IMPLANT    . CORONARY ARTERY BYPASS GRAFT    . EYE SURGERY    . PROSTATECTOMY    . PROSTATECTOMY      History reviewed. No pertinent family history.  Social History:  reports that he has never smoked. He has never used smokeless tobacco. He reports that he drank alcohol. He reports that he does not use drugs.  Allergies:  Allergies  Allergen Reactions  . Aspirin     "it was showing my retina was rupturing"    Medications: I have reviewed the patient's current medications.  Results for orders placed or performed during the hospital  encounter of 03/04/18 (from the past 48 hour(s))  Comprehensive metabolic panel     Status: Abnormal   Collection Time: 03/04/18  6:32 AM  Result Value Ref Range   Sodium 138 135 - 145 mmol/L   Potassium 4.6 3.5 - 5.1 mmol/L   Chloride 109 98 - 111 mmol/L   CO2 21 (L) 22 - 32 mmol/L   Glucose, Bld 186 (H) 70 - 99 mg/dL   BUN 34 (H) 8 - 23 mg/dL   Creatinine, Ser 1.47 (H) 0.61 - 1.24 mg/dL   Calcium 8.3 (L) 8.9 - 10.3 mg/dL   Total Protein 5.6 (L) 6.5 - 8.1 g/dL   Albumin 3.2 (L) 3.5 - 5.0 g/dL   AST 16 15 - 41 U/L   ALT 14 0 - 44 U/L   Alkaline Phosphatase 67 38 - 126 U/L   Total Bilirubin 0.8 0.3 - 1.2 mg/dL   GFR calc non Af Amer 38 (L) >60 mL/min   GFR calc Af Amer 44 (L) >60 mL/min    Comment: (NOTE) The eGFR has been calculated using the CKD EPI equation. This calculation has not been validated in all clinical situations. eGFR's persistently <60 mL/min signify possible Chronic Kidney Disease.    Anion gap 8 5 - 15    Comment: Performed at [facility], [address] [place], [place] [address]  CBC with Differential     Status: Abnormal   Collection Time: 03/04/18  6:32 AM  Result Value Ref Range   WBC 8.8 3.8 - 10.6 K/uL  RBC 2.88 (L) 4.40 - 5.90 MIL/uL   Hemoglobin 8.9 (L) 13.0 - 18.0 g/dL   HCT 25.7 (L) 40.0 - 52.0 %   MCV 89.4 80.0 - 100.0 fL   MCH 30.9 26.0 - 34.0 pg   MCHC 34.6 32.0 - 36.0 g/dL   RDW 13.0 11.5 - 14.5 %   Platelets 125 (L) 150 - 440 K/uL   Neutrophils Relative % 71 %   Neutro Abs 6.2 1.4 - 6.5 K/uL   Lymphocytes Relative 19 %   Lymphs Abs 1.7 1.0 - 3.6 K/uL   Monocytes Relative 7 %   Monocytes Absolute 0.6 0.2 - 1.0 K/uL   Eosinophils Relative 2 %   Eosinophils Absolute 0.2 0 - 0.7 K/uL   Basophils Relative 1 %   Basophils Absolute 0.0 0 - 0.1 K/uL    Comment: Performed at [facility], [address] [place], [place] [address]  Troponin I     Status: None   Collection Time: 03/04/18  6:32 AM  Result Value Ref  Range   Troponin I <0.03 <0.03 ng/mL    Comment: Performed at [facility], [address] [place], [place] [address]  Type and screen [place]     Status: None (Preliminary result)   Collection Time: 03/04/18  6:32 AM  Result Value Ref Range   ABO/RH(D) O POS    Antibody Screen NEG    Sample Expiration 03/07/2018    Unit Number [id number]    Blood Component Type RED CELLS,LR    Unit division 00    Status of Unit ISSUED    Transfusion Status OK TO TRANSFUSE    Crossmatch Result Compatible    Unit Number [id number]    Blood Component Type RED CELLS,LR    Unit division 00    Status of Unit ISSUED    Transfusion Status OK TO TRANSFUSE    Crossmatch Result      Compatible Performed at [facility], [address] [place], [place] [address]   Lactic acid, plasma     Status: Abnormal   Collection Time: 03/04/18  7:07 AM  Result Value Ref Range   Lactic Acid, Venous 2.2 (HH) 0.5 - 1.9 mmol/L    Comment: CRITICAL RESULT CALLED TO, READ BACK BY AND VERIFIED WITH FELICIA STAROPOLI ON 01/28/12 AT 2440 QSD Performed at [facility], [address] [place], [place] [address]   Prepare RBC     Status: None   Collection Time: 03/04/18  7:58 AM  Result Value Ref Range   Order Confirmation      ORDER PROCESSED BY BLOOD BANK Performed at [facility], [address], [place], [place] [address]   ABO/Rh     Status: None   Collection Time: 03/04/18  7:58 AM  Result Value Ref Range   ABO/RH(D)      O POS Performed at [facility], [address] [place], [place] [address]   Lactic acid, plasma     Status: None   Collection Time: 03/04/18  9:50 AM  Result Value Ref Range   Lactic Acid, Venous 1.5 0.5 - 1.9 mmol/L    Comment: Performed at [facility], [address] [place], [place] [address]    Ct Abdomen Pelvis W Contrast  Result Date: 03/04/2018 CLINICAL DATA:  Bloody stools. EXAM: CT  ABDOMEN AND PELVIS WITH CONTRAST TECHNIQUE: Multidetector CT imaging of the abdomen and pelvis was performed using the standard protocol  following bolus administration of intravenous contrast. CONTRAST:  21m ISOVUE-300 IOPAMIDOL (ISOVUE-300) INJECTION 61% COMPARISON:  None. FINDINGS: Lower chest: Heart is enlarged. Coronary artery calcification is evident. Atherosclerotic calcification is noted in the wall of the thoracic aorta. Hepatobiliary: Hepatic cysts measure up to 3.1 cm diameter. There is no evidence for gallstones, gallbladder wall thickening, or pericholecystic fluid. No intrahepatic or extrahepatic biliary dilation. Extrahepatic bile duct measures 8 mm diameter, within normal limits for age. Patient is noted to have a 7 x 7 x 10 mm stone in the distal common bile duct (2:37 and well demonstrated coronal image 40 series 5). Pancreas: 12 mm cystic lesion identified in the tail of pancreas (5:57). No dilatation of the main duct. Spleen: No splenomegaly. No focal mass lesion. Adrenals/Urinary Tract: No adrenal nodule or mass. 11 mm exophytic lesion interpolar left kidney has attenuation too high to be a simple cyst. Similar 7 mm lesion identified interpolar right kidney. No evidence for hydroureter. Small left-sided bladder diverticulum evident. Stomach/Bowel: Small hiatal hernia. Stomach otherwise unremarkable. Duodenum is normally positioned as is the ligament of Treitz. No small bowel wall thickening. No small bowel dilatation. The terminal ileum is normal. The appendix is normal. Advanced diverticular changes noted left colon without definite findings of diverticulitis. Vascular/Lymphatic: There is abdominal aortic atherosclerosis without aneurysm. There is no gastrohepatic or hepatoduodenal ligament lymphadenopathy. No intraperitoneal or retroperitoneal lymphadenopathy. No pelvic sidewall lymphadenopathy. Reproductive: Brachytherapy seeds noted prostate Other: No intraperitoneal free fluid.  Musculoskeletal: No worrisome lytic or sclerotic osseous abnormality. Degenerative disc disease noted lumbar spine. IMPRESSION: 1. 7 x 7 x 10 mm common bile duct stone without substantial biliary dilatation. No evidence for cholelithiasis. 2. 12 mm cystic lesion tail of pancreas. In a patient of this age with a cyst of this size, consensus criteria recommend reimaging every 2 years x2. Repeat CT abdomen with contrast in 12 months recommended. This recommendation follows ACR consensus guidelines: Management of Incidental Pancreatic Cysts: A White Paper of the ACR Incidental Findings Committee. J Am Coll Radiol 22703;50:093-818 3. Advanced diverticular disease in the left colon without overt features of diverticulitis. 4. 11 mm exophytic lesion interpolar left kidney with attenuation too high to be a simple cyst. While this may be a cyst complicated by proteinaceous debris or hemorrhage, renal neoplasm could have similar CT imaging characteristics. Repeat imaging in 6 months could be used to ensure stability. 5.  Aortic Atherosclerois (ICD10-170.0) Electronically Signed   By: EMisty StanleyM.D.   On: 03/04/2018 07:59    ROS Blood pressure 124/66, pulse 76, temperature (!) 97.5 F (36.4 C), temperature source Axillary, resp. rate (!) 23, height 5' 7"  (1.702 m), weight 64.9 kg, SpO2 100 %. Physical Exam   Patient is awake, alert, oriented, in no acute distress Vital signs: Please see the above listed vital signs HEENT: Trachea is midline, no oral lesions appreciated, no thyromegaly noted, no jugular venous distention Cardiovascular: Regular rate and rhythm Pulmonary: Clear to auscultation Abdominal: Positive bowel sounds, soft exam Extremities: No clubbing, cyanosis or edema Neurologic: Patient moves all extremities, cranial is grossly intact Psychiatric:  Awake alert oriented and communicating  Assessment/Plan:  Lower GI bleeding. Hemoglobin is 8.9, had near syncope along with maroon bowel  movements. Being transfused and pending gastroenterology input  CT of abdomen performed which revealed  IMPRESSION: 1. 7 x 7 x 10 mm common bile duct stone without substantial biliary dilatation. No evidence for cholelithiasis. 2. 12 mm cystic lesion tail of pancreas. In a patient of  this age with a cyst of this size, consensus criteria recommend reimaging every 2 years x2. Repeat CT abdomen with contrast in 12 months recommended. This recommendation follows ACR consensus guidelines: Management of Incidental Pancreatic Cysts: A White Paper of the ACR Incidental Findings Committee. J Am Coll Radiol 2763;94:320-037. 3. Advanced diverticular disease in the left colon without overt features of diverticulitis. 4. 11 mm exophytic lesion interpolar left kidney with attenuation too high to be a simple cyst. While this may be a cyst complicated by proteinaceous debris or hemorrhage, renal neoplasm could have similar CT imaging characteristics. Repeat imaging in 6 months could be used to ensure stability. 5.  Aortic Atherosclerois (ICD10-170.0)   Electronically Signed   By: Misty Stanley M.D.   On: 03/04/2018 07:59     Elevated BUN/creatinine ratio. Patient denies hematemesis. Being volume resuscitated  Hermelinda Dellen, DO Bethany Cumming 03/04/2018, 11:25 AM

## 2018-03-04 NOTE — ED Provider Notes (Signed)
Angiocath insertion Performed by: Darel Hong  Consent: Verbal consent obtained. Risks and benefits: risks, benefits and alternatives were discussed Time out: Immediately prior to procedure a "time out" was called to verify the correct patient, procedure, equipment, support staff and site/side marked as required.  Preparation: Patient was prepped and draped in the usual sterile fashion.  Vein Location: left AC  Gauge: 20  Normal blood return and flush without difficulty Patient tolerance: Patient tolerated the procedure well with no immediate complications.      Darel Hong, MD 03/04/18 [phone]

## 2018-03-04 NOTE — Progress Notes (Signed)
Pt back from ultrasound.

## 2018-03-04 NOTE — ED Notes (Signed)
Home meds, wallet, and dentures sent up to ICU. 42 dollars cash in wallet along with license and credit cards.

## 2018-03-04 NOTE — Progress Notes (Signed)
Family Meeting Note  Advance Directive:yes  Today a meeting took place with the Patient.    The following clinical team members were present during this meeting:MD  The following were discussed:Patient's diagnosis GI bleed with acute blood loss anemia: , Patient's progosis: Unable to determine and Goals for treatment: DNR  Additional follow-up to be provided: dnr no  Update needed Yellow sheet signed and in patient chart  Time spent during discussion:16 minutes  Kevin Isidoro, MD

## 2018-03-04 NOTE — H&P (Signed)
[place] [facility] [place] NAME: Kevin Tyler    MR#:  [id number]  DATE OF BIRTH:  05/12/1918  DATE OF ADMISSION:  03/04/2018  PRIMARY CARE PHYSICIAN: Kevin Hire, MD   REQUESTING/REFERRING PHYSICIAN: Dr. Joni Tyler  CHIEF COMPLAINT:   Dark and bloody stools HISTORY OF PRESENT ILLNESS:  Kevin Tyler  is a 84 y.o. male with a known history of aortic stenosis and COPD presents to the ER due to several bloody and dark-colored stools.  Patient denies abdominal pain.  Patient's hemoglobin noted in ER to drop at 4 points and he was hypotensive and and received PRBC. Patient reports having history of rectal bleeding in the past due to radiation from prostate cancer.  He has been taking NSAIDs recently PRN. Denies chest pain or shortness of breath  PAST MEDICAL HISTORY:   Past Medical History:  Diagnosis Date  . Aortic stenosis   . Basal cell carcinoma (BCC) of upper back   . COPD (chronic obstructive pulmonary disease) ([place])   . Coronary artery disease   . GERD (gastroesophageal reflux disease)   . Hypertension   . Macular degeneration   . Myocardial infarct ([place])   . Prostate cancer ([place])   . Retinal hemorrhage     PAST SURGICAL HISTORY:   Past Surgical History:  Procedure Laterality Date  . CATARACT EXTRACTION    . CATARACT EXTRACTION W/ INTRAOCULAR LENS IMPLANT    . CORONARY ARTERY BYPASS GRAFT    . EYE SURGERY    . PROSTATECTOMY    . PROSTATECTOMY      SOCIAL HISTORY:   Social History   Tobacco Use  . Smoking status: Never Smoker  . Smokeless tobacco: Never Used  Substance Use Topics  . Alcohol use: Not Currently    FAMILY HISTORY:  History reviewed. No pertinent family history.  DRUG ALLERGIES:   Allergies  Allergen Reactions  . Aspirin     "it was showing my retina was rupturing"    REVIEW OF SYSTEMS:   Review of Systems  Constitutional: Negative.  Negative for chills, fever and malaise/fatigue.  HENT:  Negative.  Negative for ear discharge, ear pain, hearing loss, nosebleeds and sore throat.   Eyes: Negative.  Negative for blurred vision and pain.  Respiratory: Negative.  Negative for cough, hemoptysis, shortness of breath and wheezing.   Cardiovascular: Negative.  Negative for chest pain, palpitations and leg swelling.  Gastrointestinal: Positive for blood in stool. Negative for abdominal pain, diarrhea, nausea and vomiting.  Genitourinary: Negative.  Negative for dysuria.  Musculoskeletal: Negative.  Negative for back pain.  Skin: Negative.   Neurological: Negative for dizziness, tremors, speech change, focal weakness, seizures and headaches.  Endo/Heme/Allergies: Negative.  Does not bruise/bleed easily.  Psychiatric/Behavioral: Negative.  Negative for depression, hallucinations and suicidal ideas.    MEDICATIONS AT HOME:   Prior to Admission medications   Medication Sig Start Date End Date Taking? Authorizing Provider  atenolol (TENORMIN) 25 MG tablet Take 25 mg by mouth daily.  02/05/17  Yes [provider]  lisinopril (PRINIVIL,ZESTRIL) 2.5 MG tablet Take 1 tablet by mouth daily. 08/06/17  Yes [provider]  Multiple Vitamins-Minerals (PRESERVISION AREDS 2+MULTI VIT) CAPS Take 1 capsule by mouth 2 (two) times daily.   Yes [provider]  omeprazole (PRILOSEC) 20 MG capsule Take 1 capsule by mouth daily. 09/25/17  Yes [provider]  simvastatin (ZOCOR) 20 MG tablet Take 1 tablet by mouth every evening. 09/25/17  Yes [provider]  tamsulosin (FLOMAX) 0.4 MG CAPS capsule Take 1 capsule by mouth. 30 minutes after the same daily meal 02/22/17  Yes [provider]      VITAL SIGNS:  Blood pressure (!) 132/58, pulse 63, temperature 98.1 F (36.7 C), temperature source Oral, resp. rate 14, height 5\' 7"  (1.702 m), weight 64.9 kg, SpO2 97 %.  PHYSICAL EXAMINATION:   Physical Exam  Constitutional: He is oriented to person, place, and  time. No distress.  HENT:  Head: Normocephalic.  Eyes: No scleral icterus.  Neck: Normal range of motion. Neck supple. No JVD present. No tracheal deviation present.  Cardiovascular: Normal rate, regular rhythm and normal heart sounds. Exam reveals no gallop and no friction rub.  No murmur heard. Pulmonary/Chest: Effort normal and breath sounds normal. No respiratory distress. He has no wheezes. He has no rales. He exhibits no tenderness.  Abdominal: Soft. Bowel sounds are normal. He exhibits no distension and no mass. There is no tenderness. There is no rebound and no guarding.  Musculoskeletal: Normal range of motion. He exhibits no edema.  Neurological: He is alert and oriented to person, place, and time.  Skin: Skin is warm. No rash noted. No erythema.  Psychiatric: Judgment normal.      LABORATORY PANEL:   CBC Recent Labs  Lab 03/04/18 0632  WBC 8.8  HGB 8.9*  HCT 25.7*  PLT 125*   ------------------------------------------------------------------------------------------------------------------  Chemistries  Recent Labs  Lab 03/04/18 0632  NA 138  K 4.6  CL 109  CO2 21*  GLUCOSE 186*  BUN 34*  CREATININE 1.47*  CALCIUM 8.3*  AST 16  ALT 14  ALKPHOS 67  BILITOT 0.8   ------------------------------------------------------------------------------------------------------------------  Cardiac Enzymes Recent Labs  Lab 03/04/18 0632  TROPONINI <0.03   ------------------------------------------------------------------------------------------------------------------  RADIOLOGY:  Ct Abdomen Pelvis W Contrast  Result Date: 03/04/2018 CLINICAL DATA:  Bloody stools. EXAM: CT ABDOMEN AND PELVIS WITH CONTRAST TECHNIQUE: Multidetector CT imaging of the abdomen and pelvis was performed using the standard protocol following bolus administration of intravenous contrast. CONTRAST:  36mL ISOVUE-300 IOPAMIDOL (ISOVUE-300) INJECTION 61% COMPARISON:  None. FINDINGS: Lower  chest: Heart is enlarged. Coronary artery calcification is evident. Atherosclerotic calcification is noted in the wall of the thoracic aorta. Hepatobiliary: Hepatic cysts measure up to 3.1 cm diameter. There is no evidence for gallstones, gallbladder wall thickening, or pericholecystic fluid. No intrahepatic or extrahepatic biliary dilation. Extrahepatic bile duct measures 8 mm diameter, within normal limits for age. Patient is noted to have a 7 x 7 x 10 mm stone in the distal common bile duct (2:37 and well demonstrated coronal image 40 series 5). Pancreas: 12 mm cystic lesion identified in the tail of pancreas (5:57). No dilatation of the main duct. Spleen: No splenomegaly. No focal mass lesion. Adrenals/Urinary Tract: No adrenal nodule or mass. 11 mm exophytic lesion interpolar left kidney has attenuation too high to be a simple cyst. Similar 7 mm lesion identified interpolar right kidney. No evidence for hydroureter. Small left-sided bladder diverticulum evident. Stomach/Bowel: Small hiatal hernia. Stomach otherwise unremarkable. Duodenum is normally positioned as is the ligament of Treitz. No small bowel wall thickening. No small bowel dilatation. The terminal ileum is normal. The appendix is normal. Advanced diverticular changes noted left colon without definite findings of diverticulitis. Vascular/Lymphatic: There is abdominal aortic atherosclerosis without aneurysm. There is no gastrohepatic or hepatoduodenal ligament lymphadenopathy. No intraperitoneal or retroperitoneal lymphadenopathy. No pelvic sidewall lymphadenopathy. Reproductive: Brachytherapy seeds noted prostate Other: No intraperitoneal free  fluid. Musculoskeletal: No worrisome lytic or sclerotic osseous abnormality. Degenerative disc disease noted lumbar spine. IMPRESSION: 1. 7 x 7 x 10 mm common bile duct stone without substantial biliary dilatation. No evidence for cholelithiasis. 2. 12 mm cystic lesion tail of pancreas. In a patient of this  age with a cyst of this size, consensus criteria recommend reimaging every 2 years x2. Repeat CT abdomen with contrast in 12 months recommended. This recommendation follows ACR consensus guidelines: Management of Incidental Pancreatic Cysts: A White Paper of the ACR Incidental Findings Committee. J Am Coll Radiol 3810;17:510-258. 3. Advanced diverticular disease in the left colon without overt features of diverticulitis. 4. 11 mm exophytic lesion interpolar left kidney with attenuation too high to be a simple cyst. While this may be a cyst complicated by proteinaceous debris or hemorrhage, renal neoplasm could have similar CT imaging characteristics. Repeat imaging in 6 months could be used to ensure stability. 5.  Aortic Atherosclerois (ICD10-170.0) Electronically Signed   By: Misty Stanley M.D.   On: 03/04/2018 07:59    EKG:   Simona Huh bradycardia heart rate 51  IMPRESSION AND PLAN:   84 year old male with a history of aortic stenosis taking NSAIDs due to arthritis who presents with dark-colored stools  1.  GI bleed: Continue PPI and follow-up on GI consultation N.p.o. after midnight 2.  Acute blood loss anemia due to GI bleed: Patient is status post PRBC Follow CBC every 6 hours  3.  Hypotension: This is due to GI bleed and anemia with acute blood loss Hold all blood pressure medications 4.  BPH: Continue Flomax  Case discussed with intensivist All the records are reviewed and case discussed with ED provider. Management plans discussed with the patient and he is in agreement  CODE STATUS: DNR  TOTAL TIME TAKING CARE OF THIS PATIENT: 48 minutes.    Capri Veals M.D on 03/04/2018 at 12:34 PM  Between 7am to 6pm - Pager - [phone]  After 6pm go to [email or web address] - password [email or web address] [facility]  Office  [phone]  CC: Primary care physician; Kevin Hire, MD

## 2018-03-04 NOTE — ED Notes (Signed)
Pt going to ICU. Pt belongings consist of a white tshirt, a set of dentures in a pink denture cup and a brown wallet.

## 2018-03-04 NOTE — ED Notes (Signed)
Pt voicing need to have bowel movement; potty chair brought to bedside and pt assisted to chair;

## 2018-03-04 NOTE — Consult Note (Signed)
Cephas Darby, MD [address]  [place]  [place], [place] [address]  Main: [phone]  Fax: [phone] Pager: [phone]   Consultation  Referring Provider:     No ref. provider found Primary Care Physician:  Katheren Shams Primary Gastroenterologist:  Dr. Sherri Sear         Reason for Consultation:     hematochezia  Date of Admission:  03/04/2018 Date of Consultation:  03/04/2018         HPI:   Kevin Tyler is a 84 y.o. male history of aortic stenosis, coronary artery disease, bypass in 1996, hypertension, history of COPD, history of prostate cancer treated with radiation seed implantation approximately 10 years ago, prior history of lower GI bleed status post cautery as reported by the patient who presented to the ER today with several episodes of dark maroon bloody bowel movements since yesterday evening. He was initially bradycardic, heart rate in 50s, mild hypotension, required IV fluids. His hemoglobin dropped from 12.9 in 09/2017 to 8.9 in the ER. He is receiving one unit PRBCs. He is admitted to the ICU and GI is consulted. In ICU, on arrival, his heart rate was in 70s, blood pressure 120s over 80s. Per the ICU nurse, he had dark maroon blood while cleaning. Patient reports mild lower abdominal discomfort, denies nausea, vomiting, abdominal pain. Patient's daughter is bedside and she reports that he lives by himself and independent of her ADLs. He is on omeprazole 20 mg as outpatient.  Patient had CT A/P the contrast in the ER, found to have left-sided diverticulosis, incidental choledocholithiasis, about 1 cm stone in distal CBD. His LFTs are normal and patient denies right upper quadrant/epigastric pain. Gallbladder intact with no evidence of cholelithiasis. Incidental 12 mm cystic lesion in tail of pancreas.   NSAIDs: none  Antiplts/Anticoagulants/Anti thrombotics: none  GI Procedures: more than 10 years ago per patient  Past Medical History:    Diagnosis Date  . Aortic stenosis   . Basal cell carcinoma (BCC) of upper back   . COPD (chronic obstructive pulmonary disease) ([facility])   . Coronary artery disease   . GERD (gastroesophageal reflux disease)   . Hypertension   . Macular degeneration   . Myocardial infarct ([facility])   . Prostate cancer ([facility])   . Retinal hemorrhage     Past Surgical History:  Procedure Laterality Date  . CATARACT EXTRACTION    . CATARACT EXTRACTION W/ INTRAOCULAR LENS IMPLANT    . CORONARY ARTERY BYPASS GRAFT    . EYE SURGERY    . PROSTATECTOMY    . PROSTATECTOMY      Prior to Admission medications   Medication Sig Start Date End Date Taking? Authorizing Provider  atenolol (TENORMIN) 25 MG tablet Take 25 mg by mouth daily.  02/05/17  Yes [provider]  lisinopril (PRINIVIL,ZESTRIL) 2.5 MG tablet Take 1 tablet by mouth daily. 08/06/17  Yes [provider]  Multiple Vitamins-Minerals (PRESERVISION AREDS 2+MULTI VIT) CAPS Take 1 capsule by mouth 2 (two) times daily.   Yes [provider]  omeprazole (PRILOSEC) 20 MG capsule Take 1 capsule by mouth daily. 09/25/17  Yes [provider]  simvastatin (ZOCOR) 20 MG tablet Take 1 tablet by mouth every evening. 09/25/17  Yes [provider]  tamsulosin (FLOMAX) 0.4 MG CAPS capsule Take 1 capsule by mouth. 30 minutes after the same daily meal 02/22/17  Yes [provider]    History reviewed. No pertinent family history.  Social History   Tobacco Use  . Smoking status: Never Smoker  . Smokeless tobacco: Never Used  Substance Use Topics  . Alcohol use: Not Currently  . Drug use: Never    Allergies as of 03/04/2018 - Review Complete 03/04/2018  Allergen Reaction Noted  . Aspirin  03/04/2018    Review of Systems:    All systems reviewed and negative except where noted in HPI.   Physical Exam:  Vital signs in last 24 hours: Temp:  [97.4 F (36.3 C)-98.1 F (36.7 C)] 98.1 F (36.7 C) (09/09  1208) Pulse Rate:  [49-76] 63 (09/09 1208) Resp:  [11-24] 14 (09/09 1208) BP: (97-138)/(32-66) 132/58 (09/09 1208) SpO2:  [96 %-100 %] 97 % (09/09 1208) Weight:  [64.9 kg] 64.9 kg (09/09 0609) Last BM Date: 03/04/18 General:   Pleasant, cooperative in NAD Head:  Normocephalic and atraumatic. Eyes:   No icterus.   Conjunctiva pink. PERRLA. Ears:  Decreased auditory acuity. Neck:  Supple; no masses or thyroidomegaly Lungs: Respirations even and unlabored. Lungs clear to auscultation bilaterally.   No wheezes, crackles, or rhonchi.  Heart:  Regular rate and rhythm;  Without murmur, clicks, rubs or gallops Abdomen:  Soft, nondistended, nontender. Normal bowel sounds. No appreciable masses or hepatomegaly.  No rebound or guarding.  Rectal:  Dried red blood stain on rectal exam. Msk:  Symmetrical without gross deformities.  Strength normal  Extremities:  Without edema, cyanosis or clubbing. Neurologic:  Alert and oriented x3;  grossly normal neurologically. Skin:  Intact without significant lesions or rashes. Cervical Nodes:  No significant cervical adenopathy. Psych:  Alert and cooperative. Normal affect.  LAB RESULTS: CBC Latest Ref Rng & Units 03/04/2018 10/18/2017 11/15/2012  WBC 3.8 - 10.6 K/uL 8.8 5.6 4.6  Hemoglobin 13.0 - 18.0 g/dL 8.9(L) 12.9(L) 12.4(L)  Hematocrit 40.0 - 52.0 % 25.7(L) 38.0(L) 36.5(L)  Platelets 150 - 440 K/uL 125(L) 122(L) 99(L)    BMET BMP Latest Ref Rng & Units 03/04/2018 10/18/2017 11/15/2012  Glucose 70 - 99 mg/dL 186(H) 112(H) 100(H)  BUN 8 - 23 mg/dL 34(H) 24(H) 18  Creatinine 0.61 - 1.24 mg/dL 1.47(H) 1.25(H) 1.30  Sodium 135 - 145 mmol/L 138 139 134(L)  Potassium 3.5 - 5.1 mmol/L 4.6 4.1 4.0  Chloride 98 - 111 mmol/L 109 106 102  CO2 22 - 32 mmol/L 21(L) 27 25  Calcium 8.9 - 10.3 mg/dL 8.3(L) 9.0 8.2(L)    LFT Hepatic Function Latest Ref Rng & Units 03/04/2018 11/13/2012 02/25/2012  Total Protein 6.5 - 8.1 g/dL 5.6(L) 7.3 7.9  Albumin 3.5 - 5.0 g/dL  3.2(L) 3.5 4.1  AST 15 - 41 U/L 16 31 25   ALT 0 - 44 U/L 14 25 26   Alk Phosphatase 38 - 126 U/L 67 95 90  Total Bilirubin 0.3 - 1.2 mg/dL 0.8 0.7 0.7     STUDIES: Ct Abdomen Pelvis W Contrast  Result Date: 03/04/2018 CLINICAL DATA:  Bloody stools. EXAM: CT ABDOMEN AND PELVIS WITH CONTRAST TECHNIQUE: Multidetector CT imaging of the abdomen and pelvis was performed using the standard protocol following bolus administration of intravenous contrast. CONTRAST:  63m ISOVUE-300 IOPAMIDOL (ISOVUE-300) INJECTION 61% COMPARISON:  None. FINDINGS: Lower chest: Heart is enlarged. Coronary artery calcification is evident. Atherosclerotic calcification is noted in the wall of the thoracic aorta. Hepatobiliary: Hepatic cysts measure up to 3.1 cm diameter. There is no evidence for gallstones, gallbladder wall thickening, or pericholecystic fluid. No intrahepatic or extrahepatic biliary dilation. Extrahepatic bile duct measures 8 mm diameter, within  normal limits for age. Patient is noted to have a 7 x 7 x 10 mm stone in the distal common bile duct (2:37 and well demonstrated coronal image 40 series 5). Pancreas: 12 mm cystic lesion identified in the tail of pancreas (5:57). No dilatation of the main duct. Spleen: No splenomegaly. No focal mass lesion. Adrenals/Urinary Tract: No adrenal nodule or mass. 11 mm exophytic lesion interpolar left kidney has attenuation too high to be a simple cyst. Similar 7 mm lesion identified interpolar right kidney. No evidence for hydroureter. Small left-sided bladder diverticulum evident. Stomach/Bowel: Small hiatal hernia. Stomach otherwise unremarkable. Duodenum is normally positioned as is the ligament of Treitz. No small bowel wall thickening. No small bowel dilatation. The terminal ileum is normal. The appendix is normal. Advanced diverticular changes noted left colon without definite findings of diverticulitis. Vascular/Lymphatic: There is abdominal aortic atherosclerosis without  aneurysm. There is no gastrohepatic or hepatoduodenal ligament lymphadenopathy. No intraperitoneal or retroperitoneal lymphadenopathy. No pelvic sidewall lymphadenopathy. Reproductive: Brachytherapy seeds noted prostate Other: No intraperitoneal free fluid. Musculoskeletal: No worrisome lytic or sclerotic osseous abnormality. Degenerative disc disease noted lumbar spine. IMPRESSION: 1. 7 x 7 x 10 mm common bile duct stone without substantial biliary dilatation. No evidence for cholelithiasis. 2. 12 mm cystic lesion tail of pancreas. In a patient of this age with a cyst of this size, consensus criteria recommend reimaging every 2 years x2. Repeat CT abdomen with contrast in 12 months recommended. This recommendation follows ACR consensus guidelines: Management of Incidental Pancreatic Cysts: A White Paper of the ACR Incidental Findings Committee. J Am Coll Radiol 3832;91:916-606. 3. Advanced diverticular disease in the left colon without overt features of diverticulitis. 4. 11 mm exophytic lesion interpolar left kidney with attenuation too high to be a simple cyst. While this may be a cyst complicated by proteinaceous debris or hemorrhage, renal neoplasm could have similar CT imaging characteristics. Repeat imaging in 6 months could be used to ensure stability. 5.  Aortic Atherosclerois (ICD10-170.0) Electronically Signed   By: Misty Stanley M.D.   On: 03/04/2018 07:59      Impression / Plan:   Kevin Tyler is a 84 y.o. Caucasian male with history of bypass, prostate cancer, status post radiation, lower GI bleed, status post cautery admitted with hematochezia and presyncope. 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 Differentials include radiation proctitis or diverticular bleed or AVMs or less likely upper GI bleed. Patient is currently hemodynamically stable.   Hematochezia: Clear liquid diet Check iron studies, B12 and folate  levels Monitor CBC closely EGD and colonoscopy tomorrow Bowel prep today Nothing by mouth past midnight  Choledocholithiasis: incidental finding No evidence of biliary ductal dilation Normal LFTs Plan for non urgent ERCP as outpatient  Pancreatic cystic lesion: incidental finding 12 mm in size, with no high-risk features Recommend CT pancreas protocol in 12 months  Thank you for involving me in the care of this patient.  We will follow along with you    LOS: 0 days   Sherri Sear, MD  03/04/2018, 12:20 PM   Note: This dictation was prepared with Dragon dictation along with smaller phrase technology. Any transcriptional errors that result from this process are unintentional.

## 2018-03-04 NOTE — Progress Notes (Signed)
Pt to ultrasound

## 2018-03-05 ENCOUNTER — Observation Stay: Payer: Medicare Other | Admitting: Certified Registered"

## 2018-03-05 ENCOUNTER — Encounter
Admission: EM | Disposition: A | Discharge status: Home Health | Payer: Self-pay | Source: Home / Self Care | Attending: Internal Medicine

## 2018-03-05 ENCOUNTER — Encounter: Payer: Self-pay | Admitting: *Deleted

## 2018-03-05 DIAGNOSIS — I2510 Atherosclerotic heart disease of native coronary artery without angina pectoris: Secondary | ICD-10-CM | POA: Diagnosis not present

## 2018-03-05 DIAGNOSIS — K449 Diaphragmatic hernia without obstruction or gangrene: Secondary | ICD-10-CM | POA: Diagnosis not present

## 2018-03-05 DIAGNOSIS — Z8719 Personal history of other diseases of the digestive system: Secondary | ICD-10-CM

## 2018-03-05 DIAGNOSIS — D62 Acute posthemorrhagic anemia: Secondary | ICD-10-CM

## 2018-03-05 DIAGNOSIS — K922 Gastrointestinal hemorrhage, unspecified: Secondary | ICD-10-CM | POA: Diagnosis present

## 2018-03-05 DIAGNOSIS — K921 Melena: Secondary | ICD-10-CM | POA: Diagnosis not present

## 2018-03-05 DIAGNOSIS — R578 Other shock: Secondary | ICD-10-CM | POA: Diagnosis not present

## 2018-03-05 DIAGNOSIS — K5731 Diverticulosis of large intestine without perforation or abscess with bleeding: Secondary | ICD-10-CM | POA: Diagnosis not present

## 2018-03-05 DIAGNOSIS — J449 Chronic obstructive pulmonary disease, unspecified: Secondary | ICD-10-CM

## 2018-03-05 HISTORY — PX: VISCERAL ARTERY INTERVENTION: CATH118277

## 2018-03-05 HISTORY — PX: ESOPHAGOGASTRODUODENOSCOPY: SHX5428

## 2018-03-05 HISTORY — PX: COLONOSCOPY: SHX5424

## 2018-03-05 LAB — TYPE AND SCREEN
ABO/RH(D): O POS
Antibody Screen: NEGATIVE
Unit division: 0
Unit division: 0

## 2018-03-05 LAB — HEMOGLOBIN
Hemoglobin: 9.8 g/dL — ABNORMAL LOW (ref 13.0–18.0)
Hemoglobin: 10.7 g/dL — ABNORMAL LOW (ref 13.0–18.0)

## 2018-03-05 LAB — BPAM RBC
Blood Product Expiration Date: 201909292359
Blood Product Expiration Date: 201909302359
ISSUE DATE / TIME: 201909090814
ISSUE DATE / TIME: 201909090814
UNIT TYPE AND RH: 5100
Unit Type and Rh: 5100

## 2018-03-05 LAB — CBC
HEMATOCRIT: 30.7 % — AB (ref 40.0–52.0)
HEMOGLOBIN: 10.7 g/dL — AB (ref 13.0–18.0)
MCH: 30.9 pg (ref 26.0–34.0)
MCHC: 34.8 g/dL (ref 32.0–36.0)
MCV: 89 fL (ref 80.0–100.0)
Platelets: 106 10*3/uL — ABNORMAL LOW (ref 150–440)
RBC: 3.45 MIL/uL — ABNORMAL LOW (ref 4.40–5.90)
RDW: 13.4 % (ref 11.5–14.5)
WBC: 8.2 10*3/uL (ref 3.8–10.6)

## 2018-03-05 LAB — GLUCOSE, CAPILLARY: GLUCOSE-CAPILLARY: 79 mg/dL (ref 70–99)

## 2018-03-05 SURGERY — EGD (ESOPHAGOGASTRODUODENOSCOPY)
Anesthesia: General

## 2018-03-05 SURGERY — VISCERAL ARTERY INTERVENTION
Anesthesia: Moderate Sedation

## 2018-03-05 MED ORDER — CYANOCOBALAMIN 1000 MCG/ML IJ SOLN
1000.0000 ug | Freq: Once | INTRAMUSCULAR | Status: DC
  Filled 2018-03-05: qty 1

## 2018-03-05 MED ORDER — VITAMIN B-12 1000 MCG PO TABS: 1000.0000 ug | ORAL_TABLET | Freq: Every day | ORAL | 0 refills | Status: AC

## 2018-03-05 MED ORDER — LIDOCAINE HCL (CARDIAC) PF 100 MG/5ML IV SOSY
PREFILLED_SYRINGE | INTRAVENOUS | Status: DC | PRN
  Administered 2018-03-05: 50 mg via INTRAVENOUS

## 2018-03-05 MED ORDER — MIDAZOLAM HCL 2 MG/2ML IJ SOLN
INTRAMUSCULAR | Status: DC | PRN
  Administered 2018-03-05: 0.5 mg via INTRAVENOUS

## 2018-03-05 MED ORDER — PROPOFOL 500 MG/50ML IV EMUL
INTRAVENOUS | Status: DC | PRN
  Administered 2018-03-05: 50 ug/kg/min via INTRAVENOUS

## 2018-03-05 MED ORDER — HEPARIN (PORCINE) IN NACL 1000-0.9 UT/500ML-% IV SOLN
INTRAVENOUS | Status: AC
  Filled 2018-03-05: qty 1000

## 2018-03-05 MED ORDER — PROPOFOL 500 MG/50ML IV EMUL
INTRAVENOUS | Status: AC
  Filled 2018-03-05: qty 50

## 2018-03-05 MED ORDER — FENTANYL CITRATE (PF) 100 MCG/2ML IJ SOLN
INTRAMUSCULAR | Status: AC
  Filled 2018-03-05: qty 2

## 2018-03-05 MED ORDER — IOPAMIDOL (ISOVUE-300) INJECTION 61%
INTRAVENOUS | Status: DC | PRN
  Administered 2018-03-05: 35 mL via INTRAVENOUS

## 2018-03-05 MED ORDER — MIDAZOLAM HCL 2 MG/2ML IJ SOLN
INTRAMUSCULAR | Status: AC
  Filled 2018-03-05: qty 2

## 2018-03-05 MED ORDER — PROPOFOL 10 MG/ML IV BOLUS
INTRAVENOUS | Status: DC | PRN
  Administered 2018-03-05: 20 mg via INTRAVENOUS
  Administered 2018-03-05: 30 mg via INTRAVENOUS

## 2018-03-05 MED ORDER — TRAZODONE HCL 50 MG PO TABS
50.0000 mg | ORAL_TABLET | Freq: Every evening | ORAL | Status: DC | PRN
  Administered 2018-03-05: 50 mg via ORAL
  Filled 2018-03-05: qty 1

## 2018-03-05 MED ORDER — SODIUM CHLORIDE FLUSH 0.9 % IV SOLN
INTRAVENOUS | Status: AC
  Filled 2018-03-05: qty 40

## 2018-03-05 MED ORDER — FENTANYL CITRATE (PF) 100 MCG/2ML IJ SOLN
INTRAMUSCULAR | Status: DC | PRN
  Administered 2018-03-05: 25 ug via INTRAVENOUS

## 2018-03-05 MED ORDER — OMEPRAZOLE 40 MG PO CPDR: 20.0000 mg | DELAYED_RELEASE_CAPSULE | Freq: Every day | ORAL | 0 refills | Status: AC

## 2018-03-05 MED ORDER — CEFAZOLIN SODIUM-DEXTROSE 1-4 GM/50ML-% IV SOLN
INTRAVENOUS | Status: AC
  Administered 2018-03-05: 1 g
  Filled 2018-03-05: qty 50

## 2018-03-05 SURGICAL SUPPLY — 17 items
BLOCK BEAD 500-700 (Vascular Products) ×3 IMPLANT
CATH BEACON 5 .035 65 C2 TIP (CATHETERS) ×3 IMPLANT
CATH MICROCATH PRGRT 2.8F 110 (CATHETERS) ×1 IMPLANT
CATH PIG 70CM (CATHETERS) ×3 IMPLANT
CATH VS15FR (CATHETERS) ×3 IMPLANT
DEVICE STARCLOSE SE CLOSURE (Vascular Products) ×3 IMPLANT
DEVICE TORQUE .025-.038 (MISCELLANEOUS) ×3 IMPLANT
GLIDECATH 4FR STR (CATHETERS) ×3 IMPLANT
GLIDEWIRE ADV .035X180CM (WIRE) ×3 IMPLANT
GUIDEWIRE ANGLED .035 180CM (WIRE) ×3 IMPLANT
MICROCATH PROGREAT 2.8F 110 CM (CATHETERS) ×3
NEEDLE ENTRY 21GA 7CM ECHOTIP (NEEDLE) ×3 IMPLANT
PACK ANGIOGRAPHY (CUSTOM PROCEDURE TRAY) ×3 IMPLANT
SET INTRO CAPELLA COAXIAL (SET/KITS/TRAYS/PACK) ×3 IMPLANT
SHEATH BRITE TIP 5FRX11 (SHEATH) ×3 IMPLANT
TUBING CONTRAST HIGH PRESS 72 (TUBING) ×3 IMPLANT
WIRE J 3MM .035X145CM (WIRE) ×3 IMPLANT

## 2018-03-05 NOTE — Progress Notes (Signed)
Patient remains clinically stable post visceral angiogram with embolization per Dr Delana Meyer. Daughter at bedside with father. Daughter given patients wallet to take home. Vitals stable post procedure, right groin without bleeding nor hematoma. Alert and oriented. Sinus rhythm per monitor. Report called to [facility] ICU 4 with questions answered.

## 2018-03-05 NOTE — Anesthesia Post-op Follow-up Note (Signed)
Anesthesia QCDR form completed.        

## 2018-03-05 NOTE — Anesthesia Preprocedure Evaluation (Signed)
Anesthesia Evaluation  Patient identified by MRN, date of birth, ID band Patient awake    Reviewed: Allergy & Precautions, H&P , NPO status , Patient's Chart, lab work & pertinent test results, reviewed documented beta blocker date and time   Airway Mallampati: II   Neck ROM: full    Dental  (+) Poor Dentition   Pulmonary neg pulmonary ROS, COPD,    Pulmonary exam normal        Cardiovascular Exercise Tolerance: Poor hypertension, + CAD, + Past MI and + Peripheral Vascular Disease  negative cardio ROS Normal cardiovascular exam Rhythm:regular Rate:Normal     Neuro/Psych  Neuromuscular disease negative neurological ROS  negative psych ROS   GI/Hepatic negative GI ROS, Neg liver ROS, GERD  Medicated,  Endo/Other  negative endocrine ROS  Renal/GU CRFRenal diseasenegative Renal ROS  negative genitourinary   Musculoskeletal   Abdominal   Peds  Hematology negative hematology ROS (+) anemia ,   Anesthesia Other Findings Past Medical History: No date: Aortic stenosis No date: Basal cell carcinoma (BCC) of upper back No date: COPD (chronic obstructive pulmonary disease) ([facility]) No date: Coronary artery disease No date: GERD (gastroesophageal reflux disease) No date: Hypertension No date: Macular degeneration No date: Myocardial infarct ([facility]) No date: Prostate cancer ([place]) No date: Retinal hemorrhage Past Surgical History: No date: CATARACT EXTRACTION No date: CATARACT EXTRACTION W/ INTRAOCULAR LENS IMPLANT No date: CORONARY ARTERY BYPASS GRAFT No date: EYE SURGERY No date: PROSTATECTOMY No date: PROSTATECTOMY BMI    Body Mass Index:  22.40 kg/m     Reproductive/Obstetrics negative OB ROS                             Anesthesia Physical Anesthesia Plan  ASA: III and emergent  Anesthesia Plan: General   Post-op Pain Management:    Induction:   PONV Risk Score and Plan:   Airway  Management Planned:   Additional Equipment:   Intra-op Plan:   Post-operative Plan:   Informed Consent: I have reviewed the patients History and Physical, chart, labs and discussed the procedure including the risks, benefits and alternatives for the proposed anesthesia with the patient or authorized representative who has indicated his/her understanding and acceptance.   Dental Advisory Given  Plan Discussed with: CRNA  Anesthesia Plan Comments:         Anesthesia Quick Evaluation

## 2018-03-05 NOTE — Transfer of Care (Signed)
Immediate Anesthesia Transfer of Care Note  Patient: Kevin Tyler  Procedure(s) Performed: ESOPHAGOGASTRODUODENOSCOPY (EGD) (N/A ) COLONOSCOPY (N/A )  Patient Location: PACU  Anesthesia Type:General  Level of Consciousness: sedated  Airway & Oxygen Therapy: Patient Spontanous Breathing and Patient connected to nasal cannula oxygen  Post-op Assessment: Report given to RN and Post -op Vital signs reviewed and stable  Post vital signs: Reviewed and stable  Last Vitals:  Vitals Value Taken Time  BP 126/55 03/05/2018  4:59 PM  Temp    Pulse 79 03/05/2018  4:59 PM  Resp 11 03/05/2018  4:59 PM  SpO2 100 % 03/05/2018  4:59 PM  Vitals shown include unvalidated device data.  Last Pain:  Vitals:   03/05/18 1520  TempSrc:   PainSc: 0-No pain         Complications: No apparent anesthesia complications

## 2018-03-05 NOTE — Op Note (Signed)
[facility] Percutaneous Study/Intervention Procedural Note     Surgeon(s): Nurse, children's: none  Pre-operative Diagnosis: 1. Lower GI bleed 2.  Diverticular disease  Post-operative diagnosis: Same  Procedure(s) Performed: 1. Ultrasound guidance for vascular access right femoral artery 2. Catheter placement into the IMA 3 sigmoid branches were selected 3. Aortogram and selective angiogram of the inferior mesenteric artery 4. Microbead embolization of selected branches of the IMA with 500-700  polyvinyl alcohol beads. 5. StarClose closure device right femoral artery  Anesthesia: Moderate conscious sedation for approximately 42 minutes using one half mg of Versed and 25 Mcg of Fentanyl  EBL: 20 cc  Fluoro Time: 9.4 minutes  Contrast: 35 cc  Indications: Patient is a 84 y.o.male with brisk lower GI bleeding with resultant anemia. The patient has a colonoscopy this afternoon which identified a specific diverticular that was bleeding.  Attempts at clipping this diverticulum were not successful at stopping the hemorrhage but did localize with the active site of bleeding quite well.  The patient is brought in for angiography for further evaluation and potential treatment. Risks and benefits are discussed and informed consent is obtained  Procedure: The patient was identified and appropriate procedural time out was performed. The patient was then placed supine on the table and prepped and draped in the usual sterile fashion.Moderate conscious sedation was administered during a face to face encounter with the patient throughout the procedure with my supervision of the RN administering medicines and monitoring the patient's vital signs, pulse oximetry, telemetry and mental status throughout from the start of the procedure until the patient was taken to the  recovery room.  Ultrasound was used to evaluate the right common femoral artery. It was patent . A digital ultrasound image was acquired. A micropuncture needle was used to access the right common femoral artery under direct ultrasound guidance and a permanent image was performed.  Microwire was advanced under fluoroscopic guidance followed by a micro sheath. A 0.035 J wire was advanced without resistance and a 5Fr sheath was placed. Pigtail catheter was placed into the aorta and an RAO infrarenal aortogram was performed. This demonstrated origin of the IMA with a moderate stenosis we transitioned to the AP projection to cannulate the IMA. A VS1 catheter was used to selectively cannulate the IMA.    This demonstrated active bleeding in the area immediately adjacent to the clip. Based on his continued bleeding I elected to treat this area with embolization. I initially advanced the Pro-Great microcatheter out the V S1 and into the sigmoid arteries, a total of 3 were individually selected.  Based on the initial selection of the IMA the first sigmoid branch to be selected was the one the course closest to the clip.  I instilled slightly more than 1 cc of 500 to 700 m PVC beads.  Follow-up imaging demonstrated the blush was no longer present.  I then redirected the catheter into the adjacent sigmoid branch and instilled approximately 1/2 cc in this location.  I selected the third largest sigmoid branch but hand-injection of contrast into this area did not show any blush and it was getting farther from the location marked by the clip and therefore I did not feel that embolization was appropriate in this region.  Angiogram following this showed the main vessels to be open with less brisk filling.   The diagnostic catheter was removed. StarClose closure device was deployed in usual fashion with excellent hemostatic result. The patient was taken to  the recovery room in stable condition having tolerated the  procedure well.     Findings:The abdominal aorta is opacified with a bolus injection contrast.  There is a moderate 50% stenosis at the origin of the IMA but the IMA is otherwise generous in size.  The IMA is then selected with the V S1 catheter and selective imaging demonstrates an active blush closely associated to the clip that is been placed during colonoscopy.  Prograde catheter was then used to select this area and embolization is performed as above with cessation of the blush.  Disposition: Patient was taken to the recovery room in stable condition having tolerated the procedure well.  Complications: None  Hortencia Pilar 03/05/2018 6:59 PM   This note was created with Dragon Medical transcription system. Any errors in dictation are purely unintentional.

## 2018-03-05 NOTE — Progress Notes (Signed)
Name: Kevin Tyler MRN: [id number] DOB: [birth year]    ADMISSION DATE:  03/04/2018  BRIEF PATIENT DESCRIPTION:  84 yo male admitted to stepdown unit 09/9 with lower GI bleed likely diverticular and syncope s/p visceral angiogram with embolization   SIGNIFICANT EVENTS  09/9-Pt admitted to stepdown unit with lower GI bleed.  H&H stable pt transferred to [facility] unit  09/10-Pt underwent colonoscopy which revealed blood in the rectum, in the recto-sigmoid colon, in the sigmoid colon and in the descending colon. Severe diverticulosis in the recto-sigmoid colon and in the sigmoid colon. There was active bleeding coming from the diverticular opening. Clip (MR conditional) was placed. Non-bleeding external and internal hemorrhoids. No specimens collected. Per GI recommendations vascular consulted due to persistent bleeding pt underwent visceral angiogram with embolization   STUDIES: CT Abdomen Pelvis 09/9>>7 x 7 x 10 mm common bile duct stone without substantial biliary dilatation. No evidence for cholelithiasis. 12 mm cystic lesion tail of pancreas. In a patient of this age with a cyst of this size, consensus criteria recommend reimaging every 2 years x2. Repeat CT abdomen with contrast in 12 months recommended. This recommendation follows ACR consensus guidelines: Management of Incidental Pancreatic Cysts: A White Paper of the ACR Incidental Findings Committee. [place] 4742;59:563 -923. Advanced diverticular disease in the left colon without overt features of diverticulitis. 4. 11 mm exophytic lesion interpolar left kidney with attenuation too high to be a simple cyst. While this may be a cyst complicated by proteinaceous debris or hemorrhage, renal neoplasm could have similar CT imaging characteristics. Repeat imaging in 6 months could be used to ensure stability. Aortic Atherosclerois (ICD10-170.0 US Venous Bilateral Lower Extremities 09/9>>No evidence of deep venous thrombosis in either  lower extremity. Upper Endoscopy 09/10>>negative   PAST MEDICAL HISTORY :   has a past medical history of Aortic stenosis, Basal cell carcinoma (BCC) of upper back, COPD (chronic obstructive pulmonary disease) ([place]), Coronary artery disease, GERD (gastroesophageal reflux disease), Hypertension, Macular degeneration, Myocardial infarct [facility]), Prostate cancer ([place]), and Retinal hemorrhage.  has a past surgical history that includes Coronary artery bypass graft; Eye surgery; Prostatectomy; Cataract extraction; Cataract extraction w/ intraocular lens implant; and Prostatectomy.  REVIEW OF SYSTEMS:   Constitutional: Negative for fever, chills, weight loss, malaise/fatigue and diaphoresis.  HENT: Negative for hearing loss, ear pain, nosebleeds, congestion, sore throat, neck pain, tinnitus and ear discharge.   Eyes: Negative for blurred vision, double vision, photophobia, pain, discharge and redness.  Respiratory: Negative for cough, hemoptysis, sputum production, shortness of breath, wheezing and stridor.   Cardiovascular: Negative for chest pain, palpitations, orthopnea, claudication, leg swelling and PND.  Gastrointestinal: heartburn, nausea, vomiting, abdominal pain, diarrhea, constipation, blood in stool and melena.  Genitourinary: Negative for dysuria, urgency, frequency, hematuria and flank pain.  Musculoskeletal: Negative for myalgias, back pain, joint pain and falls.  Skin: Negative for itching and rash.  Neurological: Negative for dizziness, tingling, tremors, sensory change, speech change, focal weakness, seizures, loss of consciousness, weakness and headaches.  Endo/Heme/Allergies: Negative for environmental allergies and polydipsia. Does not bruise/bleed easily.  SUBJECTIVE:  No complaints at this time   VITAL SIGNS: Temp:  [97 F (36.1 C)-98.7 F (37.1 C)] 98.7 F (37.1 C) (09/10 1738) Pulse Rate:  [70-83] 76 (09/10 1930) Resp:  [11-20] 15 (09/10 1945) BP: (108-147)/(51-86)  142/66 (09/10 1945) SpO2:  [95 %-100 %] 96 % (09/10 1930) Weight:  [64.9 kg] 64.9 kg (09/10 1738)  PHYSICAL EXAMINATION: General: well developed elderly male, resting in  bed NAD  Neuro: alert and oriented, HOH  HEENT: supple, no JVD  Cardiovascular: nsr, rrr, no R/G  Lungs: clear throughout, even, non labored  Abdomen: +BS x4, soft, non tender, non distended  Musculoskeletal: normal bulk and tone, no edema  Skin: right groin vascular site dressing clean and dry no hematoma or bleeding present   Recent Labs  Lab 03/04/18 0632  NA 138  K 4.6  CL 109  CO2 21*  BUN 34*  CREATININE 1.47*  GLUCOSE 186*   Recent Labs  Lab 03/04/18 0632 03/04/18 1624 03/05/18 0135 03/05/18 1055 03/05/18 2022  HGB 8.9* 10.8*  11.0* 10.7* 9.8* 10.7*  HCT 25.7* 31.1* 30.7*  --   --   WBC 8.8  --  8.2  --   --   PLT 125*  --  106*  --   --    Ct Abdomen Pelvis W Contrast  Result Date: 03/04/2018 CLINICAL DATA:  Bloody stools. EXAM: CT ABDOMEN AND PELVIS WITH CONTRAST TECHNIQUE: Multidetector CT imaging of the abdomen and pelvis was performed using the standard protocol following bolus administration of intravenous contrast. CONTRAST:  65mL ISOVUE-300 IOPAMIDOL (ISOVUE-300) INJECTION 61% COMPARISON:  None. FINDINGS: Lower chest: Heart is enlarged. Coronary artery calcification is evident. Atherosclerotic calcification is noted in the wall of the thoracic aorta. Hepatobiliary: Hepatic cysts measure up to 3.1 cm diameter. There is no evidence for gallstones, gallbladder wall thickening, or pericholecystic fluid. No intrahepatic or extrahepatic biliary dilation. Extrahepatic bile duct measures 8 mm diameter, within normal limits for age. Patient is noted to have a 7 x 7 x 10 mm stone in the distal common bile duct (2:37 and well demonstrated coronal image 40 series 5). Pancreas: 12 mm cystic lesion identified in the tail of pancreas (5:57). No dilatation of the main duct. Spleen: No splenomegaly. No focal mass  lesion. Adrenals/Urinary Tract: No adrenal nodule or mass. 11 mm exophytic lesion interpolar left kidney has attenuation too high to be a simple cyst. Similar 7 mm lesion identified interpolar right kidney. No evidence for hydroureter. Small left-sided bladder diverticulum evident. Stomach/Bowel: Small hiatal hernia. Stomach otherwise unremarkable. Duodenum is normally positioned as is the ligament of Treitz. No small bowel wall thickening. No small bowel dilatation. The terminal ileum is normal. The appendix is normal. Advanced diverticular changes noted left colon without definite findings of diverticulitis. Vascular/Lymphatic: There is abdominal aortic atherosclerosis without aneurysm. There is no gastrohepatic or hepatoduodenal ligament lymphadenopathy. No intraperitoneal or retroperitoneal lymphadenopathy. No pelvic sidewall lymphadenopathy. Reproductive: Brachytherapy seeds noted prostate Other: No intraperitoneal free fluid. Musculoskeletal: No worrisome lytic or sclerotic osseous abnormality. Degenerative disc disease noted lumbar spine. IMPRESSION: 1. 7 x 7 x 10 mm common bile duct stone without substantial biliary dilatation. No evidence for cholelithiasis. 2. 12 mm cystic lesion tail of pancreas. In a patient of this age with a cyst of this size, consensus criteria recommend reimaging every 2 years x2. Repeat CT abdomen with contrast in 12 months recommended. This recommendation follows ACR consensus guidelines: Management of Incidental Pancreatic Cysts: A White Paper of the ACR Incidental Findings Committee. J Am Coll Radiol 2536;64:403-474. 3. Advanced diverticular disease in the left colon without overt features of diverticulitis. 4. 11 mm exophytic lesion interpolar left kidney with attenuation too high to be a simple cyst. While this may be a cyst complicated by proteinaceous debris or hemorrhage, renal neoplasm could have similar CT imaging characteristics. Repeat imaging in 6 months could be used  to ensure stability. 5.  Aortic Atherosclerois (ICD10-170.0) Electronically Signed   By: Misty Stanley M.D.   On: 03/04/2018 07:59   US Venous Img Lower Bilateral  Result Date: 03/04/2018 CLINICAL DATA:  84 year old male with 2 days of bilateral lower extremity pain EXAM: BILATERAL LOWER EXTREMITY VENOUS DOPPLER ULTRASOUND TECHNIQUE: Gray-scale sonography with graded compression, as well as color Doppler and duplex ultrasound were performed to evaluate the lower extremity deep venous systems from the level of the common femoral vein and including the common femoral, femoral, profunda femoral, popliteal and calf veins including the posterior tibial, peroneal and gastrocnemius veins when visible. The superficial great saphenous vein was also interrogated. Spectral Doppler was utilized to evaluate flow at rest and with distal augmentation maneuvers in the common femoral, femoral and popliteal veins. COMPARISON:  None. FINDINGS: RIGHT LOWER EXTREMITY Common Femoral Vein: No evidence of thrombus. Normal compressibility, respiratory phasicity and response to augmentation. Saphenofemoral Junction: No evidence of thrombus. Normal compressibility and flow on color Doppler imaging. Profunda Femoral Vein: No evidence of thrombus. Normal compressibility and flow on color Doppler imaging. Femoral Vein: No evidence of thrombus. Normal compressibility, respiratory phasicity and response to augmentation. Popliteal Vein: No evidence of thrombus. Normal compressibility, respiratory phasicity and response to augmentation. Calf Veins: No evidence of thrombus. Normal compressibility and flow on color Doppler imaging. Superficial Great Saphenous Vein: No evidence of thrombus. Normal compressibility. Venous Reflux:  None. Other Findings:  None. LEFT LOWER EXTREMITY Common Femoral Vein: No evidence of thrombus. Normal compressibility, respiratory phasicity and response to augmentation. Saphenofemoral Junction: No evidence of thrombus.  Normal compressibility and flow on color Doppler imaging. Profunda Femoral Vein: No evidence of thrombus. Normal compressibility and flow on color Doppler imaging. Femoral Vein: No evidence of thrombus. Normal compressibility, respiratory phasicity and response to augmentation. Popliteal Vein: No evidence of thrombus. Normal compressibility, respiratory phasicity and response to augmentation. Calf Veins: No evidence of thrombus. Normal compressibility and flow on color Doppler imaging. Superficial Great Saphenous Vein: No evidence of thrombus. Normal compressibility. Venous Reflux:  None. Other Findings:  None. IMPRESSION: No evidence of deep venous thrombosis in either lower extremity. Electronically Signed   By: Jacqulynn Cadet M.D.   On: 03/04/2018 14:16    ASSESSMENT / PLAN:  GI Bleed likely diverticular s/p visceral angiogram with embolization of IMA (03/05/18) Serial H&H q8hrs VTE px: SCD's, avoid chemical prophylaxis  Monitor for s/sx of bleeding and transfuse for hgb <7 Continue iv Protonix  NS @75  ml/hr  Maintain map >65 GI and Vascular consulted appreciate input   Acute renal failure likely secondary to hypovolemia  Trend BMP Replace electrolytes as indicated  Monitor UOP   Choledocholithiasis (incidental finding on CT scan) Per GI will need non urgent ERCP in outpatient setting   12 mm Pancreatic cystic lesion (incidental finding on CT scan) Per GI recommending CT pancreas protocol in 12 months   Marda Stalker, [place] Pager [phone] (please enter 7 digits) PCCM Consult Pager [phone] (please enter 7 digits)

## 2018-03-05 NOTE — Progress Notes (Signed)
PER DR VANGA PATIENT WITH BLEEDING DIVERTIUCULUM ACTIVELY BLEEDING NOT AMENDABLE TO CLIPPING. CASE DISCUSSED WITH GI AND VASCULAR (VIA NURSE)  DR Marius Ditch TO CALL DR [facility] TO FURTHER DISCUSS.  I CALLED PATIENT'S NURSE TO NOTIFY OF FINDINGS.  NO DISCHARGE TODAY

## 2018-03-05 NOTE — Progress Notes (Signed)
EGD negative Colonoscopy revealed fresh blood in entire left colon. After extensive lavage, source of bleeding identified. Active bleeding from diverticulum in rectosigmoid area, approximately 30cm from anal verge. Attempted to place a hemoclip to occlude the diverticulum to control bleeding but unsuccessful. A clip was placed near the bleeding diverticulum.   Recs:  Stat IR consult for CT Angio and embolization of the bleeding vessel Discussed findings with Dr Benjie Karvonen and pt's daughter Check stat h/h and transfer to ICU  Cephas Darby, MD [id number]  [place], [place] [address]  Main: [phone]  Fax: [phone] Pager: [phone]

## 2018-03-05 NOTE — Plan of Care (Signed)

## 2018-03-05 NOTE — Progress Notes (Signed)
[facility] [place] NAME: Lebert Lovern    MR#:  [id number]  DATE OF BIRTH:  [birth year]  SUBJECTIVE:  Doing well no rectal bleeding  REVIEW OF SYSTEMS:    Review of Systems  Constitutional: Negative for fever, chills weight loss HENT: Negative for ear pain, nosebleeds, congestion, facial swelling, rhinorrhea, neck pain, neck stiffness and ear discharge.   Respiratory: Negative for cough, shortness of breath, wheezing  Cardiovascular: Negative for chest pain, palpitations and leg swelling.  Gastrointestinal: Negative for heartburn, abdominal pain, vomiting, diarrhea or consitpation Genitourinary: Negative for dysuria, urgency, frequency, hematuria Musculoskeletal: Negative for back pain or joint pain Neurological: Negative for dizziness, seizures, syncope, focal weakness,  numbness and headaches.  Hematological: Does not bruise/bleed easily.  Psychiatric/Behavioral: Negative for hallucinations, confusion, dysphoric mood    Tolerating Diet:npo      DRUG ALLERGIES:   Allergies  Allergen Reactions  . Aspirin     "it was showing my retina was rupturing"    VITALS:  Blood pressure (!) 108/51, pulse 73, temperature 98.2 F (36.8 C), temperature source Oral, resp. rate 18, height 5\' 7"  (1.702 m), weight 64.9 kg, SpO2 95 %.  PHYSICAL EXAMINATION:  Constitutional: Appears well-developed and well-nourished. No distress. HENT: Normocephalic. Marland Kitchen Oropharynx is clear and moist.  Eyes: Conjunctivae and EOM are normal. PERRLA, no scleral icterus.  Neck: Normal ROM. Neck supple. No JVD. No tracheal deviation. CVS: RRR, S1/S2 +, no murmurs, no gallops, no carotid bruit.  Pulmonary: Effort and breath sounds normal, no stridor, rhonchi, wheezes, rales.  Abdominal: Soft. BS +,  no distension, tenderness, rebound or guarding.  Musculoskeletal: Normal range of motion. No edema and no tenderness.  Neuro: Alert. CN 2-12 grossly intact. No focal  deficits. Skin: Skin is warm and dry. No rash noted. Psychiatric: Normal mood and affect.      LABORATORY PANEL:   CBC Recent Labs  Lab 03/05/18 0135  WBC 8.2  HGB 10.7*  HCT 30.7*  PLT 106*   ------------------------------------------------------------------------------------------------------------------  Chemistries  Recent Labs  Lab 03/04/18 0632  NA 138  K 4.6  CL 109  CO2 21*  GLUCOSE 186*  BUN 34*  CREATININE 1.47*  CALCIUM 8.3*  AST 16  ALT 14  ALKPHOS 67  BILITOT 0.8   ------------------------------------------------------------------------------------------------------------------  Cardiac Enzymes Recent Labs  Lab 03/04/18 0632  TROPONINI <0.03   ------------------------------------------------------------------------------------------------------------------  RADIOLOGY:  Ct Abdomen Pelvis W Contrast  Result Date: 03/04/2018 CLINICAL DATA:  Bloody stools. EXAM: CT ABDOMEN AND PELVIS WITH CONTRAST TECHNIQUE: Multidetector CT imaging of the abdomen and pelvis was performed using the standard protocol following bolus administration of intravenous contrast. CONTRAST:  4mL ISOVUE-300 IOPAMIDOL (ISOVUE-300) INJECTION 61% COMPARISON:  None. FINDINGS: Lower chest: Heart is enlarged. Coronary artery calcification is evident. Atherosclerotic calcification is noted in the wall of the thoracic aorta. Hepatobiliary: Hepatic cysts measure up to 3.1 cm diameter. There is no evidence for gallstones, gallbladder wall thickening, or pericholecystic fluid. No intrahepatic or extrahepatic biliary dilation. Extrahepatic bile duct measures 8 mm diameter, within normal limits for age. Patient is noted to have a 7 x 7 x 10 mm stone in the distal common bile duct (2:37 and well demonstrated coronal image 40 series 5). Pancreas: 12 mm cystic lesion identified in the tail of pancreas (5:57). No dilatation of the main duct. Spleen: No splenomegaly. No focal mass lesion.  Adrenals/Urinary Tract: No adrenal nodule or mass. 11 mm exophytic lesion interpolar left kidney has attenuation too  high to be a simple cyst. Similar 7 mm lesion identified interpolar right kidney. No evidence for hydroureter. Small left-sided bladder diverticulum evident. Stomach/Bowel: Small hiatal hernia. Stomach otherwise unremarkable. Duodenum is normally positioned as is the ligament of Treitz. No small bowel wall thickening. No small bowel dilatation. The terminal ileum is normal. The appendix is normal. Advanced diverticular changes noted left colon without definite findings of diverticulitis. Vascular/Lymphatic: There is abdominal aortic atherosclerosis without aneurysm. There is no gastrohepatic or hepatoduodenal ligament lymphadenopathy. No intraperitoneal or retroperitoneal lymphadenopathy. No pelvic sidewall lymphadenopathy. Reproductive: Brachytherapy seeds noted prostate Other: No intraperitoneal free fluid. Musculoskeletal: No worrisome lytic or sclerotic osseous abnormality. Degenerative disc disease noted lumbar spine. IMPRESSION: 1. 7 x 7 x 10 mm common bile duct stone without substantial biliary dilatation. No evidence for cholelithiasis. 2. 12 mm cystic lesion tail of pancreas. In a patient of this age with a cyst of this size, consensus criteria recommend reimaging every 2 years x2. Repeat CT abdomen with contrast in 12 months recommended. This recommendation follows ACR consensus guidelines: Management of Incidental Pancreatic Cysts: A White Paper of the ACR Incidental Findings Committee. J Am Coll Radiol 3295;18:841-660. 3. Advanced diverticular disease in the left colon without overt features of diverticulitis. 4. 11 mm exophytic lesion interpolar left kidney with attenuation too high to be a simple cyst. While this may be a cyst complicated by proteinaceous debris or hemorrhage, renal neoplasm could have similar CT imaging characteristics. Repeat imaging in 6 months could be used to ensure  stability. 5.  Aortic Atherosclerois (ICD10-170.0) Electronically Signed   By: Misty Stanley M.D.   On: 03/04/2018 07:59   US Venous Img Lower Bilateral  Result Date: 03/04/2018 CLINICAL DATA:  84 year old male with 2 days of bilateral lower extremity pain EXAM: BILATERAL LOWER EXTREMITY VENOUS DOPPLER ULTRASOUND TECHNIQUE: Gray-scale sonography with graded compression, as well as color Doppler and duplex ultrasound were performed to evaluate the lower extremity deep venous systems from the level of the common femoral vein and including the common femoral, femoral, profunda femoral, popliteal and calf veins including the posterior tibial, peroneal and gastrocnemius veins when visible. The superficial great saphenous vein was also interrogated. Spectral Doppler was utilized to evaluate flow at rest and with distal augmentation maneuvers in the common femoral, femoral and popliteal veins. COMPARISON:  None. FINDINGS: RIGHT LOWER EXTREMITY Common Femoral Vein: No evidence of thrombus. Normal compressibility, respiratory phasicity and response to augmentation. Saphenofemoral Junction: No evidence of thrombus. Normal compressibility and flow on color Doppler imaging. Profunda Femoral Vein: No evidence of thrombus. Normal compressibility and flow on color Doppler imaging. Femoral Vein: No evidence of thrombus. Normal compressibility, respiratory phasicity and response to augmentation. Popliteal Vein: No evidence of thrombus. Normal compressibility, respiratory phasicity and response to augmentation. Calf Veins: No evidence of thrombus. Normal compressibility and flow on color Doppler imaging. Superficial Great Saphenous Vein: No evidence of thrombus. Normal compressibility. Venous Reflux:  None. Other Findings:  None. LEFT LOWER EXTREMITY Common Femoral Vein: No evidence of thrombus. Normal compressibility, respiratory phasicity and response to augmentation. Saphenofemoral Junction: No evidence of thrombus. Normal  compressibility and flow on color Doppler imaging. Profunda Femoral Vein: No evidence of thrombus. Normal compressibility and flow on color Doppler imaging. Femoral Vein: No evidence of thrombus. Normal compressibility, respiratory phasicity and response to augmentation. Popliteal Vein: No evidence of thrombus. Normal compressibility, respiratory phasicity and response to augmentation. Calf Veins: No evidence of thrombus. Normal compressibility and flow on color Doppler imaging. Superficial Great Saphenous  Vein: No evidence of thrombus. Normal compressibility. Venous Reflux:  None. Other Findings:  None. IMPRESSION: No evidence of deep venous thrombosis in either lower extremity. Electronically Signed   By: Jacqulynn Cadet M.D.   On: 03/04/2018 14:16     ASSESSMENT AND PLAN:   84 year old male with a history of aortic stenosis taking NSAIDs due to arthritis who presents with dark-colored stools  1.  GI bleed: He had no episodes of GI bleed while in the hospital.  He was eval by GI and underwent EGD and colonoscopy.   2.  Acute blood loss anemia due to GI bleed: Patient is status post PRBC Hemoglobin has remained stable.  B12 level was low and therefore B12 supplementation has been started.  3.  Hypotension: Patient now has low/normal blood pressure and therefore his blood pressure medications have been discontinued for now.  He will need outpatient close follow-up with his PCP  4.  BPH: Continue Flomax 5. Pancreatic cystic lesion: incidental finding 12 mm in size, with no high-risk features Recommend CT pancreas protocol in 12 months       Management plans discussed with the patient and he is in agreement.  CODE STATUS: DNR  TOTAL TIME TAKING CARE OF THIS PATIENT: 28 minutes.     POSSIBLE D/C today??, DEPENDING ON CLINICAL CONDITION.   Margerite Impastato M.D on 03/05/2018 at 10:53 AM  Between 7am to 6pm - Pager - [phone] After 6pm go to [email or web address] - password [email or web address]  [facility]  Office  [phone]  CC: Primary care physician; Baxter Hire, MD  Note: This dictation was prepared with Dragon dictation along with smaller phrase technology. Any transcriptional errors that result from this process are unintentional.

## 2018-03-05 NOTE — Op Note (Signed)
[facility] Gastroenterology Patient Name: Kevin Tyler Procedure Date: 03/05/2018 4:02 PM MRN: [id number] Account #: [id number] Date of Birth: [birth year] Admit Type: Inpatient Age: 84 Room: [facility] ENDO ROOM 4 Gender: Male Note Status: Finalized Procedure:            Upper GI endoscopy Indications:          Hematochezia Providers:            Lin Landsman MD, MD Referring MD:         Baxter Hire, MD (Referring MD) Medicines:            Monitored Anesthesia Care Complications:        No immediate complications. Estimated blood loss: None. Procedure:            Pre-Anesthesia Assessment:                       - Prior to the procedure, a History and Physical was                        performed, and patient medications and allergies were                        reviewed. The patient is competent. The risks and                        benefits of the procedure and the sedation options and                        risks were discussed with the patient. All questions                        were answered and informed consent was obtained.                        Patient identification and proposed procedure were                        verified by the physician, the nurse, the                        anesthesiologist, the anesthetist and the technician in                        the pre-procedure area in the procedure room in the                        endoscopy suite. Mental Status Examination: alert and                        oriented. Airway Examination: normal oropharyngeal                        airway and neck mobility. Respiratory Examination:                        clear to auscultation. CV Examination: normal.                        Prophylactic Antibiotics: The patient does not require  prophylactic antibiotics. Prior Anticoagulants: The                        patient has taken no previous anticoagulant or   antiplatelet agents. ASA Grade Assessment: III - A                        patient with severe systemic disease. After reviewing                        the risks and benefits, the patient was deemed in                        satisfactory condition to undergo the procedure. The                        anesthesia plan was to use monitored anesthesia care                        (MAC). Immediately prior to administration of                        medications, the patient was re-assessed for adequacy                        to receive sedatives. The heart rate, respiratory rate,                        oxygen saturations, blood pressure, adequacy of                        pulmonary ventilation, and response to care were                        monitored throughout the procedure. The physical status                        of the patient was re-assessed after the procedure.                       After obtaining informed consent, the endoscope was                        passed under direct vision. Throughout the procedure,                        the patient's blood pressure, pulse, and oxygen                        saturations were monitored continuously. The Endoscope                        was introduced through the mouth, and advanced to the                        second part of duodenum. The upper GI endoscopy was                        accomplished without difficulty. The patient tolerated  the procedure well. Findings:      The duodenal bulb and second portion of the duodenum were normal.      The entire examined stomach was normal.      The cardia and gastric fundus were normal on retroflexion.      A 1 cm hiatal hernia was present.      The gastroesophageal junction and examined esophagus were normal. Impression:           - Normal duodenal bulb and second portion of the                        duodenum.                       - Normal stomach.                       - 1 cm  hiatal hernia.                       - Normal gastroesophageal junction and esophagus.                       - No specimens collected. Recommendation:       - Proceed with colonoscopy as scheduled as no upper GI                        source of bleeding identified                       - See colonoscopy report Procedure Code(s):    --- Professional ---                       [phone], Esophagogastroduodenoscopy, flexible, transoral;                        diagnostic, including collection of specimen(s) by                        brushing or washing, when performed (separate procedure) Diagnosis Code(s):    --- Professional ---                       K44.9, Diaphragmatic hernia without obstruction or                        gangrene                       K92.1, Melena (includes Hematochezia) CPT copyright 2017 American Medical Association. All rights reserved. The codes documented in this report are preliminary and upon coder review may  be revised to meet current compliance requirements. Dr. Ulyess Mort Lin Landsman MD, MD 03/05/2018 4:12:15 PM This report has been signed electronically. Number of Addenda: 0 Note Initiated On: 03/05/2018 4:02 PM      [facility]

## 2018-03-05 NOTE — Consult Note (Signed)
[facility] Vascular Consult Note  MRN : [id number]  Kevin Tyler is a 84 y.o. (10/27/1917) male who presents with chief complaint of  Chief Complaint  Patient presents with  . Rectal Bleeding  .  History of Present Illness:   I am asked to evaluate the patient by Dr. Benjie Karvonen for GI bleed.  The patient is a 84 year old gentleman who presented to [facility] yesterday with multiple bloody bowel movements.  The patient reported that the bleeding was abrupt in onset.  This occurred 6 or 7 times.  The nature of the stool was maroon in color.  The movements were painless.  He does not have any recent sick contacts.  He denies fever chills.  The location has been rectal there is been no vomiting or hematemesis.  He denies other associated factors at this time with the exception that he has been taking nonsteroidal anti-inflammatories for arthritis.  He does have a past history of GI bleed and a known history of diverticulosis.  His last GI bleed was 10 years ago.  He also has a history of prostate radiation but again this was 10 to 15 years ago.  He did become dizzy and  upon admission was given 2 units of blood.  Colonoscopy was performed earlier today and after sufficient lavage a bleeding diverticulum was identified.  Attempts at clipping the diverticulum did not achieve hemostasis but clearly marked the area that is bleeding.  Current Facility-Administered Medications  Medication Dose Route Frequency Provider Last Rate Last Dose  . 0.9 %  sodium chloride infusion   Intravenous Continuous Bettey Costa, MD 75 mL/hr at 03/05/18 0933    . 0.9 %  sodium chloride infusion   Intravenous Continuous Vanga, Tally Due, MD      . Doug Sou Hold] acetaminophen (TYLENOL) tablet 650 mg  650 mg Oral Q6H PRN Bettey Costa, MD       Or  . Doug Sou Hold] acetaminophen (TYLENOL) suppository 650 mg  650 mg Rectal Q6H PRN Bettey Costa, MD      . Doug Sou Hold] cyanocobalamin  ((VITAMIN B-12)) injection 1,000 mcg  1,000 mcg Intramuscular Once Bettey Costa, MD      . Doug Sou Hold] Influenza vac split quadrivalent PF (FLUZONE HIGH-DOSE) injection 0.5 mL  0.5 mL Intramuscular Tomorrow-1000 Vira Agar, Juan Quam, RN      . [MAR Hold] ondansetron (ZOFRAN) tablet 4 mg  4 mg Oral Q6H PRN Bettey Costa, MD       Or  . Doug Sou Hold] ondansetron (ZOFRAN) injection 4 mg  4 mg Intravenous Q6H PRN Bettey Costa, MD      . Doug Sou Hold] oxyCODONE (Oxy IR/ROXICODONE) immediate release tablet 5 mg  5 mg Oral Q4H PRN Bettey Costa, MD      . Doug Sou Hold] pantoprazole (PROTONIX) injection 40 mg  40 mg Intravenous Q12H Bettey Costa, MD   40 mg at 03/05/18 0827  . [MAR Hold] polyethylene glycol (MIRALAX / GLYCOLAX) packet 17 g  17 g Oral Daily PRN Bettey Costa, MD      . Doug Sou Hold] tamsulosin (FLOMAX) capsule 0.4 mg  0.4 mg Oral QPC supper Bettey Costa, MD   0.4 mg at 03/04/18 1718    Past Medical History:  Diagnosis Date  . Aortic stenosis   . Basal cell carcinoma (BCC) of upper back   . COPD (chronic obstructive pulmonary disease) ([place])   . Coronary artery disease   . GERD (gastroesophageal reflux disease)   .  Hypertension   . Macular degeneration   . Myocardial infarct ([place])   . Prostate cancer ([place])   . Retinal hemorrhage     Past Surgical History:  Procedure Laterality Date  . CATARACT EXTRACTION    . CATARACT EXTRACTION W/ INTRAOCULAR LENS IMPLANT    . CORONARY ARTERY BYPASS GRAFT    . EYE SURGERY    . PROSTATECTOMY    . PROSTATECTOMY      Social History Social History   Tobacco Use  . Smoking status: Never Smoker  . Smokeless tobacco: Never Used  Substance Use Topics  . Alcohol use: Not Currently  . Drug use: Never    Family History History reviewed. No pertinent family history. No family history of bleeding/clotting disorders, porphyria or autoimmune disease   Allergies  Allergen Reactions  . Aspirin     "it was showing my retina was rupturing"     REVIEW OF SYSTEMS  (Negative unless checked)  Constitutional: [] Weight loss  [] Fever  [] Chills Cardiac: [] Chest pain   [] Chest pressure   [] Palpitations   [] Shortness of breath when laying flat   [] Shortness of breath at rest   [] Shortness of breath with exertion. Vascular:  [] Pain in legs with walking   [] Pain in legs at rest   [] Pain in legs when laying flat   [] Claudication   [] Pain in feet when walking  [] Pain in feet at rest  [] Pain in feet when laying flat   [] History of DVT   [] Phlebitis   [] Swelling in legs   [] Varicose veins   [] Non-healing ulcers Pulmonary:   [] Uses home oxygen   [] Productive cough   [] Hemoptysis   [] Wheeze  [] COPD   [] Asthma Neurologic:  [x] Dizziness  [] Blackouts   [] Seizures   [] History of stroke   [] History of TIA  [] Aphasia   [] Temporary blindness   [] Dysphagia   [] Weakness or numbness in arms   [] Weakness or numbness in legs Musculoskeletal:  [] Arthritis   [x] Joint swelling   [x] Joint pain   [] Low back pain Hematologic:  [] Easy bruising  [] Easy bleeding   [] Hypercoagulable state   [x] Anemic  [] Hepatitis Gastrointestinal:  [x] Blood in stool   [] Vomiting blood  [x] Gastroesophageal reflux/heartburn   [] Difficulty swallowing. Genitourinary:  [] Chronic kidney disease   [] Difficult urination  [] Frequent urination  [] Burning with urination   [] Blood in urine Skin:  [] Rashes   [] Ulcers   [] Wounds Psychological:  [] History of anxiety   []  History of major depression.   Physical Examination  Vitals:   03/05/18 1520 03/05/18 1659 03/05/18 1738 03/05/18 1818  BP: (!) 147/59 (!) 126/55 (!) 146/56   Pulse: 77 79 70   Resp: 16 11 20    Temp: (!) 97.3 F (36.3 C) (!) 97 F (36.1 C) 98.7 F (37.1 C)   TempSrc:  Tympanic Oral   SpO2: 100% 100% 98% 98%  Weight:   64.9 kg   Height:   5\' 7"  (1.702 m)    Body mass index is 22.41 kg/m.  Head: [place]/AT, No temporalis wasting. Prominent temp pulse not noted. Ear/Nose/Throat: Nares w/o erythema or drainage, oropharynx w/o obsrtuction, Mallampati  score: 3.  Dentition poor.  Eyes: PERRLA, Sclera nonicteric.  Neck: Supple, no nuchal rigidity.  No bruit or JVD.  Pulmonary:  Breath sounds equal bilaterally, no use of accessory muscles.  Cardiac: RRR, normal S1, S2, no Murmurs, rubs or gallops. Gastrointestinal: soft, non-tender, non-distended.  Musculoskeletal: Moves all extremities.  No deformity or atrophy. No edema. Neurologic: CN 2-12 intact. Symmetrical.  Speech is fluent.  Psychiatric: Judgment intact, Mood & affect appropriate for pt's clinical situation. Dermatologic: No rashes or ulcers noted.  No cellulitis or open wounds. Lymph : No Cervical,  or Inguinal lymphadenopathy.      CBC Lab Results  Component Value Date   WBC 8.2 03/05/2018   HGB 9.8 (L) 03/05/2018   HCT 30.7 (L) 03/05/2018   MCV 89.0 03/05/2018   PLT 106 (L) 03/05/2018    BMET    Component Value Date/Time   NA 138 03/04/2018 0632   NA 134 (L) 11/15/2012 0223   K 4.6 03/04/2018 0632   K 4.0 11/15/2012 0223   CL 109 03/04/2018 0632   CL 102 11/15/2012 0223   CO2 21 (L) 03/04/2018 0632   CO2 25 11/15/2012 0223   GLUCOSE 186 (H) 03/04/2018 0632   GLUCOSE 100 (H) 11/15/2012 0223   BUN 34 (H) 03/04/2018 0632   BUN 18 11/15/2012 0223   CREATININE 1.47 (H) 03/04/2018 0632   CREATININE 1.30 11/15/2012 0223   CALCIUM 8.3 (L) 03/04/2018 0632   CALCIUM 8.2 (L) 11/15/2012 0223   GFRNONAA 38 (L) 03/04/2018 0632   GFRNONAA 47 (L) 11/15/2012 0223   GFRAA 44 (L) 03/04/2018 3419   GFRAA 55 (L) 11/15/2012 0223   Estimated Creatinine Clearance: 25.8 mL/min (A) (by C-G formula based on SCr of 1.47 mg/dL (H)).  COAG No results found for: INR, PROTIME  Radiology CT scan performed yesterday is reviewed by myself and demonstrates diffuse diverticulosis of the left colon; diffuse but mild atherosclerotic changes are noted   Assessment/Plan 1.  GI bleed likely diverticular: The patient has persistent GI bleeding.  Although we do not have a nuclear scan we  have visual confirmation from the colonoscopy as to the location.  This is substantiated by the CT scan which demonstrates predominantly left sided diverticular disease.  Therefore, I do feel it is appropriate to move forward with embolization and select the IMA so that we can focus on the sigmoid colon.  Risks and benefits were reviewed with the patient's daughter as the patient is still somewhat groggy following his anesthesia for his colonoscopy.  All questions have been answered she wishes for [place] to proceed.  2.  Coronary artery disease: Continue cardiac and antihypertensive medications as already ordered and reviewed, no changes at this time.  Continue statin as ordered and reviewed, no changes at this time  Nitrates PRN for chest pain  3.  COPD: Continue pulmonary medications and aerosols as already ordered, these medications have been reviewed and there are no changes at this time.  4.  Gastroesophageal reflux disease: Continue PPI as already ordered, this medication has been reviewed and there are no changes at this time.  Avoidence of caffeine and alcohol  Moderate elevation of the head of the bed     Hortencia Pilar, MD  03/05/2018 7:19 PM

## 2018-03-05 NOTE — Op Note (Signed)
[facility] Gastroenterology Patient Name: Kevin Tyler Procedure Date: 03/05/2018 4:12 PM MRN: [id number] Account #: [id number] Date of Birth: 1918/06/04 Admit Type: Inpatient Age: 84 Room: [facility] ENDO ROOM 4 Gender: Male Note Status: Finalized Procedure:            Colonoscopy Indications:          Rectal bleeding, Acute post hemorrhagic anemia Providers:            Lin Landsman MD, MD Medicines:            Monitored Anesthesia Care Complications:        No immediate complications. Procedure:            Pre-Anesthesia Assessment:                       - Prior to the procedure, a History and Physical was                        performed, and patient medications and allergies were                        reviewed. The patient is competent. The risks and                        benefits of the procedure and the sedation options and                        risks were discussed with the patient. All questions                        were answered and informed consent was obtained.                        Patient identification and proposed procedure were                        verified by the physician, the nurse, the                        anesthesiologist, the anesthetist and the technician in                        the pre-procedure area in the procedure room in the                        endoscopy suite. Mental Status Examination: alert and                        oriented. Airway Examination: normal oropharyngeal                        airway and neck mobility. Respiratory Examination:                        clear to auscultation. CV Examination: normal.                        Prophylactic Antibiotics: The patient does not require  prophylactic antibiotics. Prior Anticoagulants: The                        patient has taken no previous anticoagulant or                        antiplatelet agents. ASA Grade Assessment: III - A          patient with severe systemic disease. After reviewing                        the risks and benefits, the patient was deemed in                        satisfactory condition to undergo the procedure. The                        anesthesia plan was to use monitored anesthesia care                        (MAC). Immediately prior to administration of                        medications, the patient was re-assessed for adequacy                        to receive sedatives. The heart rate, respiratory rate,                        oxygen saturations, blood pressure, adequacy of                        pulmonary ventilation, and response to care were                        monitored throughout the procedure. The physical status                        of the patient was re-assessed after the procedure.                       After obtaining informed consent, the colonoscope was                        passed under direct vision. Throughout the procedure,                        the patient's blood pressure, pulse, and oxygen                        saturations were monitored continuously. The Endoscope                        was introduced through the anus and advanced to the the                        cecum, identified by appendiceal orifice and ileocecal                        valve. The colonoscopy was performed without  difficulty. The patient tolerated the procedure well.                        The quality of the bowel preparation was adequate. Findings:      The digital rectal exam findings include blood on glove. Pertinent       negatives include normal sphincter tone and no palpable rectal lesions.      Red blood was found in the rectum, in the recto-sigmoid colon, in the       sigmoid colon and in the descending colon. After extensive lavage,       active bleeding was identified in the rectosigmoid/sigmoid colon from a       diverticulum      Multiple diverticula were  found in the recto-sigmoid colon and sigmoid       colon. There was active bleeding coming from the diverticular opening in       the sigmoid colon. For location marking, one hemostatic clip was       successfully placed (MR conditional).      Hemostasis could not be achieved      Non-bleeding external and internal hemorrhoids were found during       retroflexion. The hemorrhoids were large. Impression:           - Blood on glove found on digital rectal exam.                       - Blood in the rectum, in the recto-sigmoid colon, in                        the sigmoid colon and in the descending colon.                       - Severe diverticulosis in the recto-sigmoid colon and                        in the sigmoid colon. There was active bleeding coming                        from the diverticular opening. Clip (MR conditional)                        was placed.                       - Non-bleeding external and internal hemorrhoids.                       - No specimens collected. Recommendation:       - Stat IR consult for embolization, personally                        discussed findings with Dr Delana Meyer                       - Monitor CBC closely                       - Monitor in ICU                       - Pt's daughter aware of the findings                       -  Do a GI bleeding (tagged RBC) scan today. Procedure Code(s):    --- Professional ---                       [phone], Colonoscopy, flexible; diagnostic, including                        collection of specimen(s) by brushing or washing, when                        performed (separate procedure)                       [phone], Unlisted procedure, colon Diagnosis Code(s):    --- Professional ---                       K64.8, Other hemorrhoids                       K57.31, Diverticulosis of large intestine without                        perforation or abscess with bleeding                       K62.5, Hemorrhage of anus and rectum                        K92.2, Gastrointestinal hemorrhage, unspecified                       D62, Acute posthemorrhagic anemia CPT copyright 2017 American Medical Association. All rights reserved. The codes documented in this report are preliminary and upon coder review may  be revised to meet current compliance requirements. Dr. Ulyess Mort Lin Landsman MD, MD 03/05/2018 5:19:08 PM This report has been signed electronically. Number of Addenda: 0 Note Initiated On: 03/05/2018 4:12 PM Scope Withdrawal Time: 0 hours 31 minutes 31 seconds  Total Procedure Duration: 0 hours 39 minutes 35 seconds       [facility]

## 2018-03-06 ENCOUNTER — Encounter: Payer: Self-pay | Admitting: Vascular Surgery

## 2018-03-06 DIAGNOSIS — R402253 Coma scale, best verbal response, oriented, at hospital admission: Secondary | ICD-10-CM | POA: Diagnosis not present

## 2018-03-06 DIAGNOSIS — K648 Other hemorrhoids: Secondary | ICD-10-CM | POA: Diagnosis not present

## 2018-03-06 DIAGNOSIS — N400 Enlarged prostate without lower urinary tract symptoms: Secondary | ICD-10-CM | POA: Diagnosis not present

## 2018-03-06 DIAGNOSIS — M2550 Pain in unspecified joint: Secondary | ICD-10-CM | POA: Diagnosis not present

## 2018-03-06 DIAGNOSIS — R402143 Coma scale, eyes open, spontaneous, at hospital admission: Secondary | ICD-10-CM | POA: Diagnosis not present

## 2018-03-06 DIAGNOSIS — I350 Nonrheumatic aortic (valve) stenosis: Secondary | ICD-10-CM | POA: Diagnosis not present

## 2018-03-06 DIAGNOSIS — M2540 Effusion, unspecified joint: Secondary | ICD-10-CM | POA: Diagnosis not present

## 2018-03-06 DIAGNOSIS — K449 Diaphragmatic hernia without obstruction or gangrene: Secondary | ICD-10-CM | POA: Diagnosis not present

## 2018-03-06 DIAGNOSIS — Z951 Presence of aortocoronary bypass graft: Secondary | ICD-10-CM | POA: Diagnosis not present

## 2018-03-06 DIAGNOSIS — Z23 Encounter for immunization: Secondary | ICD-10-CM | POA: Diagnosis present

## 2018-03-06 DIAGNOSIS — E861 Hypovolemia: Secondary | ICD-10-CM | POA: Diagnosis not present

## 2018-03-06 DIAGNOSIS — D62 Acute posthemorrhagic anemia: Secondary | ICD-10-CM | POA: Diagnosis not present

## 2018-03-06 DIAGNOSIS — I959 Hypotension, unspecified: Secondary | ICD-10-CM | POA: Diagnosis not present

## 2018-03-06 DIAGNOSIS — I2510 Atherosclerotic heart disease of native coronary artery without angina pectoris: Secondary | ICD-10-CM | POA: Diagnosis not present

## 2018-03-06 DIAGNOSIS — I10 Essential (primary) hypertension: Secondary | ICD-10-CM | POA: Diagnosis not present

## 2018-03-06 DIAGNOSIS — K219 Gastro-esophageal reflux disease without esophagitis: Secondary | ICD-10-CM | POA: Diagnosis not present

## 2018-03-06 DIAGNOSIS — K922 Gastrointestinal hemorrhage, unspecified: Secondary | ICD-10-CM | POA: Diagnosis not present

## 2018-03-06 DIAGNOSIS — N179 Acute kidney failure, unspecified: Secondary | ICD-10-CM | POA: Diagnosis not present

## 2018-03-06 DIAGNOSIS — K5731 Diverticulosis of large intestine without perforation or abscess with bleeding: Secondary | ICD-10-CM | POA: Diagnosis not present

## 2018-03-06 DIAGNOSIS — R578 Other shock: Secondary | ICD-10-CM | POA: Diagnosis not present

## 2018-03-06 DIAGNOSIS — R402363 Coma scale, best motor response, obeys commands, at hospital admission: Secondary | ICD-10-CM | POA: Diagnosis not present

## 2018-03-06 DIAGNOSIS — J449 Chronic obstructive pulmonary disease, unspecified: Secondary | ICD-10-CM | POA: Diagnosis not present

## 2018-03-06 DIAGNOSIS — Z66 Do not resuscitate: Secondary | ICD-10-CM | POA: Diagnosis not present

## 2018-03-06 DIAGNOSIS — Z8719 Personal history of other diseases of the digestive system: Secondary | ICD-10-CM | POA: Diagnosis not present

## 2018-03-06 DIAGNOSIS — K8050 Calculus of bile duct without cholangitis or cholecystitis without obstruction: Secondary | ICD-10-CM | POA: Diagnosis not present

## 2018-03-06 DIAGNOSIS — K862 Cyst of pancreas: Secondary | ICD-10-CM | POA: Diagnosis not present

## 2018-03-06 LAB — BASIC METABOLIC PANEL
Anion gap: 7 (ref 5–15)
BUN: 15 mg/dL (ref 8–23)
CALCIUM: 8.3 mg/dL — AB (ref 8.9–10.3)
CO2: 21 mmol/L — ABNORMAL LOW (ref 22–32)
Chloride: 112 mmol/L — ABNORMAL HIGH (ref 98–111)
Creatinine, Ser: 1.18 mg/dL (ref 0.61–1.24)
GFR calc Af Amer: 57 mL/min — ABNORMAL LOW (ref >60)
GFR, EST NON AFRICAN AMERICAN: 49 mL/min — AB (ref >60)
Glucose, Bld: 78 mg/dL (ref 70–99)
POTASSIUM: 4 mmol/L (ref 3.5–5.1)
SODIUM: 140 mmol/L (ref 135–145)

## 2018-03-06 LAB — CBC
HCT: 28.2 % — ABNORMAL LOW (ref 40.0–52.0)
Hemoglobin: 9.6 g/dL — ABNORMAL LOW (ref 13.0–18.0)
MCH: 30.7 pg (ref 26.0–34.0)
MCHC: 34.1 g/dL (ref 32.0–36.0)
MCV: 90.1 fL (ref 80.0–100.0)
PLATELETS: 104 10*3/uL — AB (ref 150–440)
RBC: 3.13 MIL/uL — ABNORMAL LOW (ref 4.40–5.90)
RDW: 13.4 % (ref 11.5–14.5)
WBC: 7.9 10*3/uL (ref 3.8–10.6)

## 2018-03-06 LAB — HEMOGLOBIN
HEMOGLOBIN: 9.1 g/dL — AB (ref 13.0–18.0)
HEMOGLOBIN: 9.7 g/dL — AB (ref 13.0–18.0)

## 2018-03-06 MED ORDER — SODIUM CHLORIDE 0.9 % IV SOLN
510.0000 mg | Freq: Once | INTRAVENOUS | Status: AC
  Administered 2018-03-06: 16:00:00 510 mg via INTRAVENOUS
  Filled 2018-03-06: qty 17

## 2018-03-06 NOTE — Care Management Note (Addendum)
Case Management Note  Patient Details  Name: Kevin Tyler MRN: [id number] Date of Birth: 1917/07/19  Subjective/Objective:   Admitted to [facility] with the diagnosis of hemorrhagic shock. Lives alone. Daughter is Melven Sartorius [phone]). Seen Dr. Harrel Lemon about 2 weeks ago. Prescriptions are filled at [facility]. No home Health in the past. Skilled nursing facility in 1993. Three rolling walkers and a cane in the home. No falls. Good appetite. Daughter will transport.                Action/Plan: Spoke with daughter and [name] in the room. Discussed Home Health agencies. [facility]. Will update [facility] representative. Daughter is planning to stay at the home with her father.    Expected Discharge Date:  03/06/18               Expected Discharge Plan:     In-House Referral:   yes  Discharge planning Services   yes  Post Acute Care Choice:   yes Choice offered to:   [name] and daughter  DME Arranged:    DME Agency:     HH Arranged:   yes HH Agency:   [facility]  Status of Service:     If discussed at [employer] of Stay Meetings, dates discussed:    Additional Comments:  Shelbie Ammons, RN MSN CCM Care Management [phone] 03/06/2018, 11:53 AM

## 2018-03-06 NOTE — Progress Notes (Signed)
Patient arrived from specials a&o X's 4 and denies pain.  Pt did have a medium incontinent maroon colored stool upon arrival. Pt verbalizes that he is passing gas. Patient given CHG bath upon arrival. Continue to monitor pateints hgb q 8 hours, notify MD if significant drop. Gauze dressing cover with tegaderm to right groin. Clean dry and intact. Soft with no eccymosis and no hematoma noted.  VSS.

## 2018-03-06 NOTE — Plan of Care (Addendum)
Nutrition Education Note  RD consulted for nutrition education regarding a high fiber diet.  84 year old male with PMHx of COPD, CAD, GERD, HTN, macular degeneration, aortic stenosis, hx MI who was admitted with bloody stools found to have actively bleeding diverticulum in the sigmoid colon now s/p embolization.  Met with patient and his daughter at bedside. Patient sleeping so education provided to daughter. Daughter reports that patient has a good appetite and intake. He finished about 85% of his lunch today. He eats 3 meals per day. For breakfast he has cereal. For lunch he may have a sandwich or leftovers. Dinner is usually prepared by family or he may have a microwave meal. He also enjoys fruits and vegetables at meals. Daughter reports he is weight-stable at around 145 lbs. Current weight in chart is 64.9 kg (143.08 lbs).  RD provided "High Fiber Nutrition Therapy" handout from the [employer]. Discussed that since patient was actively bleeding to eat a regular diet approximately 1-2 weeks until slowly increasing fiber intake. Encouraged slow increase in fiber intake over several weeks to a goal of 25-35 grams of fiber per day. Reviewed foods that are good sources of fiber including whole grains, fruits, and vegetables. Provided food list of recommended foods that contain at least 4 grams of fiber per serving. Encouraged adequate intake of fluid to prevent constipation. Teach back method used.  Expect good compliance.  Body mass index is 22.41 kg/m. Pt meets criteria for normal weight based on current BMI.   Current diet order is Regular, patient is consuming approximately 85% of meals at this time. Labs and medications reviewed. No further nutrition interventions warranted at this time. RD contact information provided. If additional nutrition issues arise, please re-consult RD.  Willey Blade, MS, [place], LDN Office: [phone] Pager: [phone] After Hours/Weekend  Pager: [phone]

## 2018-03-06 NOTE — Discharge Summary (Signed)
[facility] [place] NAME: Kevin Tyler    MR#:  [id number]  DATE OF BIRTH:  1917/11/07  DATE OF ADMISSION:  03/04/2018 ADMITTING PHYSICIAN: Bettey Costa, MD  DATE OF DISCHARGE: 03/06/2018  PRIMARY CARE PHYSICIAN: Baxter Hire, MD    ADMISSION DIAGNOSIS:  Hemorrhagic shock ([place]) [R57.8] Acute GI bleeding [K92.2] GI bleed [K92.2]  DISCHARGE DIAGNOSIS:  Active Problems:   GIB (gastrointestinal bleeding)   GI bleed   SECONDARY DIAGNOSIS:   Past Medical History:  Diagnosis Date  . Aortic stenosis   . Basal cell carcinoma (BCC) of upper back   . COPD (chronic obstructive pulmonary disease) ([place])   . Coronary artery disease   . GERD (gastroesophageal reflux disease)   . Hypertension   . Macular degeneration   . Myocardial infarct ([place])   . Prostate cancer ([place])   . Retinal hemorrhage     HOSPITAL COURSE:  84 year old male with a history of aortic stenosis taking NSAIDs due to arthritis who presents with dark-colored stools  1. GI bleed: Patient underwent EGD and colonoscopy. EGD was essentially unremarkable for acute blood loss. Colonoscopy showed severe diverticulosis in the rectosigmoid colon and active bleeding from diverticular openine. Clip was placed and he went emergently for embolization of IMA. His hemoglobin is now stable as are vital signs.  HE needs high fiber diet.  2. Acute blood loss anemia due to GI bleed: Patient is status post PRBC Hemoglobin has remained stable since embolization.  B12 level was low and therefore B12 supplementation has been started.  3. Hypotension: Patient now has low/normal blood pressure and therefore his blood pressure medications have been discontinued for now.  He will need outpatient close follow-up with his PCP  4. BPH: Continue Flomax 5. Pancreatic cystic lesion: incidental finding 12 mm in size, with no high-risk features Recommend CT pancreas protocol in 12  months   DISCHARGE CONDITIONS AND DIET:   Stable Regular high fiber diet  CONSULTS OBTAINED:  Treatment Team:  Lin Landsman, MD Schnier, Dolores Lory, MD  DRUG ALLERGIES:   Allergies  Allergen Reactions  . Aspirin     "it was showing my retina was rupturing"    DISCHARGE MEDICATIONS:   Allergies as of 03/06/2018      Reactions   Aspirin    "it was showing my retina was rupturing"      Medication List    STOP taking these medications   atenolol 25 MG tablet Commonly known as:  TENORMIN   lisinopril 2.5 MG tablet Commonly known as:  PRINIVIL,ZESTRIL     TAKE these medications   omeprazole 40 MG capsule Commonly known as:  PRILOSEC Take 1 capsule (40 mg total) by mouth daily. What changed:    medication strength  how much to take   PRESERVISION AREDS 2+MULTI VIT Caps Take 1 capsule by mouth 2 (two) times daily.   simvastatin 20 MG tablet Commonly known as:  ZOCOR Take 1 tablet by mouth every evening.   tamsulosin 0.4 MG Caps capsule Commonly known as:  FLOMAX Take 1 capsule by mouth. 30 minutes after the same daily meal   vitamin B-12 1000 MCG tablet Commonly known as:  CYANOCOBALAMIN Take 1 tablet (1,000 mcg total) by mouth daily.         Today   CHIEF COMPLAINT:  Doing well hungry no dizziness or chest pain   VITAL SIGNS:  Blood pressure 120/62, pulse 87, temperature 97.7 F (36.5 C),  temperature source Oral, resp. rate 14, height 5\' 7"  (1.702 m), weight 64.9 kg, SpO2 96 %.   REVIEW OF SYSTEMS:  Review of Systems  Constitutional: Negative.  Negative for chills, fever and malaise/fatigue.  HENT: Negative.  Negative for ear discharge, ear pain, hearing loss, nosebleeds and sore throat.   Eyes: Negative.  Negative for blurred vision and pain.  Respiratory: Negative.  Negative for cough, hemoptysis, shortness of breath and wheezing.   Cardiovascular: Negative.  Negative for chest pain, palpitations and leg swelling.   Gastrointestinal: Negative.  Negative for abdominal pain, blood in stool, diarrhea, nausea and vomiting.  Genitourinary: Negative.  Negative for dysuria.  Musculoskeletal: Negative.  Negative for back pain.  Skin: Negative.   Neurological: Negative for dizziness, tremors, speech change, focal weakness, seizures and headaches.  Endo/Heme/Allergies: Negative.  Does not bruise/bleed easily.  Psychiatric/Behavioral: Negative.  Negative for depression, hallucinations and suicidal ideas.     PHYSICAL EXAMINATION:  GENERAL:  84 y.o.-year-old patient lying in the bed with no acute distress.  NECK:  Supple, no jugular venous distention. No thyroid enlargement, no tenderness.  LUNGS: Normal breath sounds bilaterally, no wheezing, rales,rhonchi  No use of accessory muscles of respiration.  CARDIOVASCULAR: S1, S2 normal. No murmurs, rubs, or gallops.  ABDOMEN: Soft, non-tender, non-distended. Bowel sounds present. No organomegaly or mass.  EXTREMITIES: No pedal edema, cyanosis, or clubbing.  PSYCHIATRIC: The patient is alert and oriented x 3.  SKIN: No obvious rash, lesion, or ulcer.   DATA REVIEW:   CBC Recent Labs  Lab 03/06/18 0437 03/06/18 0828  WBC 7.9  --   HGB 9.6* 9.7*  HCT 28.2*  --   PLT 104*  --     Chemistries  Recent Labs  Lab 03/04/18 0632 03/06/18 0437  NA 138 140  K 4.6 4.0  CL 109 112*  CO2 21* 21*  GLUCOSE 186* 78  BUN 34* 15  CREATININE 1.47* 1.18  CALCIUM 8.3* 8.3*  AST 16  --   ALT 14  --   ALKPHOS 67  --   BILITOT 0.8  --     Cardiac Enzymes Recent Labs  Lab 03/04/18 0632  TROPONINI <0.03    Microbiology Results  @[email or web address] @  RADIOLOGY:  US Venous Img Lower Bilateral  Result Date: 03/04/2018 CLINICAL DATA:  84 year old male with 2 days of bilateral lower extremity pain EXAM: BILATERAL LOWER EXTREMITY VENOUS DOPPLER ULTRASOUND TECHNIQUE: Gray-scale sonography with graded compression, as well as color Doppler and duplex ultrasound were  performed to evaluate the lower extremity deep venous systems from the level of the common femoral vein and including the common femoral, femoral, profunda femoral, popliteal and calf veins including the posterior tibial, peroneal and gastrocnemius veins when visible. The superficial great saphenous vein was also interrogated. Spectral Doppler was utilized to evaluate flow at rest and with distal augmentation maneuvers in the common femoral, femoral and popliteal veins. COMPARISON:  None. FINDINGS: RIGHT LOWER EXTREMITY Common Femoral Vein: No evidence of thrombus. Normal compressibility, respiratory phasicity and response to augmentation. Saphenofemoral Junction: No evidence of thrombus. Normal compressibility and flow on color Doppler imaging. Profunda Femoral Vein: No evidence of thrombus. Normal compressibility and flow on color Doppler imaging. Femoral Vein: No evidence of thrombus. Normal compressibility, respiratory phasicity and response to augmentation. Popliteal Vein: No evidence of thrombus. Normal compressibility, respiratory phasicity and response to augmentation. Calf Veins: No evidence of thrombus. Normal compressibility and flow on color Doppler imaging. Superficial Great Saphenous Vein: No evidence of thrombus. Normal  compressibility. Venous Reflux:  None. Other Findings:  None. LEFT LOWER EXTREMITY Common Femoral Vein: No evidence of thrombus. Normal compressibility, respiratory phasicity and response to augmentation. Saphenofemoral Junction: No evidence of thrombus. Normal compressibility and flow on color Doppler imaging. Profunda Femoral Vein: No evidence of thrombus. Normal compressibility and flow on color Doppler imaging. Femoral Vein: No evidence of thrombus. Normal compressibility, respiratory phasicity and response to augmentation. Popliteal Vein: No evidence of thrombus. Normal compressibility, respiratory phasicity and response to augmentation. Calf Veins: No evidence of thrombus. Normal  compressibility and flow on color Doppler imaging. Superficial Great Saphenous Vein: No evidence of thrombus. Normal compressibility. Venous Reflux:  None. Other Findings:  None. IMPRESSION: No evidence of deep venous thrombosis in either lower extremity. Electronically Signed   By: Jacqulynn Cadet M.D.   On: 03/04/2018 14:16      Allergies as of 03/06/2018      Reactions   Aspirin    "it was showing my retina was rupturing"      Medication List    STOP taking these medications   atenolol 25 MG tablet Commonly known as:  TENORMIN   lisinopril 2.5 MG tablet Commonly known as:  PRINIVIL,ZESTRIL     TAKE these medications   omeprazole 40 MG capsule Commonly known as:  PRILOSEC Take 1 capsule (40 mg total) by mouth daily. What changed:    medication strength  how much to take   PRESERVISION AREDS 2+MULTI VIT Caps Take 1 capsule by mouth 2 (two) times daily.   simvastatin 20 MG tablet Commonly known as:  ZOCOR Take 1 tablet by mouth every evening.   tamsulosin 0.4 MG Caps capsule Commonly known as:  FLOMAX Take 1 capsule by mouth. 30 minutes after the same daily meal   vitamin B-12 1000 MCG tablet Commonly known as:  CYANOCOBALAMIN Take 1 tablet (1,000 mcg total) by mouth daily.        }   Management plans discussed with the patient and daughter and they are in agreement. Stable for discharge home with [facility]  Patient should follow up with pcp  CODE STATUS:     Code Status Orders  (From admission, onward)         Start     Ordered   03/04/18 0850  Do not attempt resuscitation (DNR)  Continuous    Question Answer Comment  In the event of cardiac or respiratory ARREST Do not call a "code blue"   In the event of cardiac or respiratory ARREST Do not perform Intubation, CPR, defibrillation or ACLS   In the event of cardiac or respiratory ARREST Use medication by any route, position, wound care, and other measures to relive pain and suffering. May use oxygen,  suction and manual treatment of airway obstruction as needed for comfort.      03/04/18 0849        Code Status History    This patient has a current code status but no historical code status.    Advance Directive Documentation     Most Recent Value  Type of Advance Directive  Healthcare Power of Attorney  Pre-existing out of facility DNR order (yellow form or pink MOST form)  -  "MOST" Form in Place?  -      TOTAL TIME TAKING CARE OF THIS PATIENT: 38 minutes.    Note: This dictation was prepared with Dragon dictation along with smaller phrase technology. Any transcriptional errors that result from this process are unintentional.  Gordana Kewley  M.D on 03/06/2018 at 11:13 AM  Between 7am to 6pm - Pager - [phone] After 6pm go to [email or web address] - password [email or web address] [facility]  Office  [phone]  CC: Primary care physician; Baxter Hire, MD

## 2018-03-06 NOTE — Consult Note (Signed)
Cephas Darby, MD [address]  [place]  [place], [place] [address]  Main: [phone]  Fax: [phone] Pager: [phone]   Subjective: No acute events overnight, underwent colonoscopy yesterday found to have actively bleeding diverticulum in the sigmoid colon status post embolization by IR.Hemoglobin is stable. Tolerating diet well.   Objective: Vital signs in last 24 hours: Vitals:   03/06/18 0700 03/06/18 0800 03/06/18 0900 03/06/18 1014  BP: (!) 108/58 (!) 114/55 (!) 116/58 120/62  Pulse: 78 81 92 87  Resp: 11 11 14    Temp:  (!) 97.5 F (36.4 C)  97.7 F (36.5 C)  TempSrc:  Oral  Oral  SpO2: 97% 96% 95% 96%  Weight:      Height:       Weight change:   Intake/Output Summary (Last 24 hours) at 03/06/2018 1344 Last data filed at 03/06/2018 0651 Gross per 24 hour  Intake 100.62 ml  Output 975 ml  Net -874.38 ml     Exam: Heart:: Regular rate and rhythm or S1S2 present Lungs: clear to auscultation Abdomen: soft, nontender, normal bowel sounds   Lab Results: CBC Latest Ref Rng & Units 03/06/2018 03/06/2018 03/06/2018  WBC 3.8 - 10.6 K/uL - 7.9 -  Hemoglobin 13.0 - 18.0 g/dL 9.7(L) 9.6(L) 9.1(L)  Hematocrit 40.0 - 52.0 % - 28.2(L) -  Platelets 150 - 440 K/uL - 104(L) -   BMP Latest Ref Rng & Units 03/06/2018 03/04/2018 10/18/2017  Glucose 70 - 99 mg/dL 78 186(H) 112(H)  BUN 8 - 23 mg/dL 15 34(H) 24(H)  Creatinine 0.61 - 1.24 mg/dL 1.18 1.47(H) 1.25(H)  Sodium 135 - 145 mmol/L 140 138 139  Potassium 3.5 - 5.1 mmol/L 4.0 4.6 4.1  Chloride 98 - 111 mmol/L 112(H) 109 106  CO2 22 - 32 mmol/L 21(L) 21(L) 27  Calcium 8.9 - 10.3 mg/dL 8.3(L) 8.3(L) 9.0    Micro Results: Recent Results (from the past 240 hour(s))  MRSA PCR Screening     Status: None   Collection Time: 03/04/18  9:38 AM  Result Value Ref Range Status   MRSA by PCR NEGATIVE NEGATIVE Final    Comment:        The GeneXpert MRSA Assay (FDA approved for NASAL specimens only), is one  component of a comprehensive MRSA colonization surveillance program. It is not intended to diagnose MRSA infection nor to guide or monitor treatment for MRSA infections. Performed at [facility], [address] [place], [place] [address]    Studies/Results: US Venous Img Lower Bilateral  Result Date: 03/04/2018 CLINICAL DATA:  84 year old male with 2 days of bilateral lower extremity pain EXAM: BILATERAL LOWER EXTREMITY VENOUS DOPPLER ULTRASOUND TECHNIQUE: Gray-scale sonography with graded compression, as well as color Doppler and duplex ultrasound were performed to evaluate the lower extremity deep venous systems from the level of the common femoral vein and including the common femoral, femoral, profunda femoral, popliteal and calf veins including the posterior tibial, peroneal and gastrocnemius veins when visible. The superficial great saphenous vein was also interrogated. Spectral Doppler was utilized to evaluate flow at rest and with distal augmentation maneuvers in the common femoral, femoral and popliteal veins. COMPARISON:  None. FINDINGS: RIGHT LOWER EXTREMITY Common Femoral Vein: No evidence of thrombus. Normal compressibility, respiratory phasicity and response to augmentation. Saphenofemoral Junction: No evidence of thrombus. Normal compressibility and flow on color Doppler imaging. Profunda Femoral Vein: No evidence of thrombus. Normal compressibility and flow on color Doppler imaging. Femoral Vein: No  evidence of thrombus. Normal compressibility, respiratory phasicity and response to augmentation. Popliteal Vein: No evidence of thrombus. Normal compressibility, respiratory phasicity and response to augmentation. Calf Veins: No evidence of thrombus. Normal compressibility and flow on color Doppler imaging. Superficial Great Saphenous Vein: No evidence of thrombus. Normal compressibility. Venous Reflux:  None. Other Findings:  None. LEFT LOWER EXTREMITY Common Femoral Vein: No  evidence of thrombus. Normal compressibility, respiratory phasicity and response to augmentation. Saphenofemoral Junction: No evidence of thrombus. Normal compressibility and flow on color Doppler imaging. Profunda Femoral Vein: No evidence of thrombus. Normal compressibility and flow on color Doppler imaging. Femoral Vein: No evidence of thrombus. Normal compressibility, respiratory phasicity and response to augmentation. Popliteal Vein: No evidence of thrombus. Normal compressibility, respiratory phasicity and response to augmentation. Calf Veins: No evidence of thrombus. Normal compressibility and flow on color Doppler imaging. Superficial Great Saphenous Vein: No evidence of thrombus. Normal compressibility. Venous Reflux:  None. Other Findings:  None. IMPRESSION: No evidence of deep venous thrombosis in either lower extremity. Electronically Signed   By: Jacqulynn Cadet M.D.   On: 03/04/2018 14:16   Medications: I have reviewed the patient's current medications. Scheduled Meds: . cyanocobalamin  1,000 mcg Intramuscular Once  . pantoprazole (PROTONIX) IV  40 mg Intravenous Q12H  . tamsulosin  0.4 mg Oral QPC supper   Continuous Infusions: . sodium chloride 10 mL/hr at 03/06/18 0651   PRN Meds:.acetaminophen **OR** acetaminophen, ondansetron **OR** ondansetron (ZOFRAN) IV, oxyCODONE, polyethylene glycol, traZODone   Assessment: Active Problems:   GIB (gastrointestinal bleeding)   GI bleed  Active diverticular bleed, status post embolization by IR  Plan: Hemoglobin stable Administer parenteral iron Regular diet Okay to discharge home today   LOS: 1 day   Rohini Vanga 03/06/2018, 1:44 PM

## 2018-03-06 NOTE — Anesthesia Postprocedure Evaluation (Signed)
Anesthesia Post Note  Patient: Kevin Tyler  Procedure(s) Performed: ESOPHAGOGASTRODUODENOSCOPY (EGD) (N/A ) COLONOSCOPY (N/A )  Patient location during evaluation: SICU Anesthesia Type: General Level of consciousness: awake Pain management: pain level controlled Vital Signs Assessment: post-procedure vital signs reviewed and stable Respiratory status: spontaneous breathing Cardiovascular status: stable Postop Assessment: no apparent nausea or vomiting Anesthetic complications: no     Last Vitals:  Vitals:   03/06/18 0630 03/06/18 0645  BP:    Pulse: 86 79  Resp: 14 11  Temp:    SpO2: 95% 97%    Last Pain:  Vitals:   03/06/18 0110  TempSrc: Oral  PainSc:                  Precious Haws Piscitello

## 2018-03-09 ENCOUNTER — Inpatient Hospital Stay
Admit: 2018-03-09 | Discharge: 2018-03-09 | Disposition: A | Discharge status: Home / Self Care | Payer: Medicare Other | Attending: Internal Medicine | Admitting: Internal Medicine

## 2018-03-09 ENCOUNTER — Other Ambulatory Visit: Payer: Self-pay

## 2018-03-09 ENCOUNTER — Emergency Department: Payer: Medicare Other

## 2018-03-09 DIAGNOSIS — Z961 Presence of intraocular lens: Secondary | ICD-10-CM | POA: Diagnosis present

## 2018-03-09 DIAGNOSIS — I129 Hypertensive chronic kidney disease with stage 1 through stage 4 chronic kidney disease, or unspecified chronic kidney disease: Secondary | ICD-10-CM | POA: Diagnosis present

## 2018-03-09 DIAGNOSIS — R7989 Other specified abnormal findings of blood chemistry: Secondary | ICD-10-CM

## 2018-03-09 DIAGNOSIS — E785 Hyperlipidemia, unspecified: Secondary | ICD-10-CM | POA: Diagnosis present

## 2018-03-09 DIAGNOSIS — E778 Other disorders of glycoprotein metabolism: Secondary | ICD-10-CM | POA: Diagnosis present

## 2018-03-09 DIAGNOSIS — K5730 Diverticulosis of large intestine without perforation or abscess without bleeding: Secondary | ICD-10-CM | POA: Diagnosis present

## 2018-03-09 DIAGNOSIS — Z886 Allergy status to analgesic agent status: Secondary | ICD-10-CM

## 2018-03-09 DIAGNOSIS — R0789 Other chest pain: Secondary | ICD-10-CM | POA: Diagnosis present

## 2018-03-09 DIAGNOSIS — N183 Chronic kidney disease, stage 3 (moderate): Secondary | ICD-10-CM | POA: Diagnosis present

## 2018-03-09 DIAGNOSIS — I350 Nonrheumatic aortic (valve) stenosis: Secondary | ICD-10-CM | POA: Diagnosis present

## 2018-03-09 DIAGNOSIS — Z923 Personal history of irradiation: Secondary | ICD-10-CM | POA: Diagnosis not present

## 2018-03-09 DIAGNOSIS — Z79899 Other long term (current) drug therapy: Secondary | ICD-10-CM

## 2018-03-09 DIAGNOSIS — Z85828 Personal history of other malignant neoplasm of skin: Secondary | ICD-10-CM | POA: Diagnosis not present

## 2018-03-09 DIAGNOSIS — H353124 Nonexudative age-related macular degeneration, left eye, advanced atrophic with subfoveal involvement: Secondary | ICD-10-CM | POA: Diagnosis present

## 2018-03-09 DIAGNOSIS — Z8546 Personal history of malignant neoplasm of prostate: Secondary | ICD-10-CM | POA: Diagnosis not present

## 2018-03-09 DIAGNOSIS — Z8669 Personal history of other diseases of the nervous system and sense organs: Secondary | ICD-10-CM | POA: Diagnosis not present

## 2018-03-09 DIAGNOSIS — Z9849 Cataract extraction status, unspecified eye: Secondary | ICD-10-CM

## 2018-03-09 DIAGNOSIS — R531 Weakness: Secondary | ICD-10-CM

## 2018-03-09 DIAGNOSIS — H353211 Exudative age-related macular degeneration, right eye, with active choroidal neovascularization: Secondary | ICD-10-CM | POA: Diagnosis present

## 2018-03-09 DIAGNOSIS — H9193 Unspecified hearing loss, bilateral: Secondary | ICD-10-CM | POA: Diagnosis present

## 2018-03-09 DIAGNOSIS — R778 Other specified abnormalities of plasma proteins: Secondary | ICD-10-CM

## 2018-03-09 DIAGNOSIS — Z66 Do not resuscitate: Secondary | ICD-10-CM | POA: Diagnosis present

## 2018-03-09 DIAGNOSIS — Z9079 Acquired absence of other genital organ(s): Secondary | ICD-10-CM | POA: Diagnosis not present

## 2018-03-09 DIAGNOSIS — D649 Anemia, unspecified: Secondary | ICD-10-CM | POA: Diagnosis present

## 2018-03-09 DIAGNOSIS — Z8719 Personal history of other diseases of the digestive system: Secondary | ICD-10-CM

## 2018-03-09 DIAGNOSIS — I214 Non-ST elevation (NSTEMI) myocardial infarction: Secondary | ICD-10-CM | POA: Diagnosis present

## 2018-03-09 DIAGNOSIS — I25110 Atherosclerotic heart disease of native coronary artery with unstable angina pectoris: Secondary | ICD-10-CM | POA: Diagnosis present

## 2018-03-09 DIAGNOSIS — K219 Gastro-esophageal reflux disease without esophagitis: Secondary | ICD-10-CM | POA: Diagnosis present

## 2018-03-09 DIAGNOSIS — J449 Chronic obstructive pulmonary disease, unspecified: Secondary | ICD-10-CM | POA: Diagnosis present

## 2018-03-09 DIAGNOSIS — R079 Chest pain, unspecified: Secondary | ICD-10-CM

## 2018-03-09 LAB — COMPREHENSIVE METABOLIC PANEL
ALT: 15 U/L (ref 0–44)
AST: 21 U/L (ref 15–41)
Albumin: 3.4 g/dL — ABNORMAL LOW (ref 3.5–5.0)
Alkaline Phosphatase: 71 U/L (ref 38–126)
Anion gap: 7 (ref 5–15)
BILIRUBIN TOTAL: 0.5 mg/dL (ref 0.3–1.2)
BUN: 18 mg/dL (ref 8–23)
CO2: 24 mmol/L (ref 22–32)
Calcium: 8.4 mg/dL — ABNORMAL LOW (ref 8.9–10.3)
Chloride: 108 mmol/L (ref 98–111)
Creatinine, Ser: 1.3 mg/dL — ABNORMAL HIGH (ref 0.61–1.24)
GFR calc Af Amer: 51 mL/min — ABNORMAL LOW (ref >60)
GFR, EST NON AFRICAN AMERICAN: 44 mL/min — AB (ref >60)
Glucose, Bld: 124 mg/dL — ABNORMAL HIGH (ref 70–99)
Potassium: 4.1 mmol/L (ref 3.5–5.1)
Sodium: 139 mmol/L (ref 135–145)
TOTAL PROTEIN: 6.3 g/dL — AB (ref 6.5–8.1)

## 2018-03-09 LAB — TROPONIN I
Troponin I: 0.2 ng/mL (ref <0.03)
Troponin I: 0.29 ng/mL (ref <0.03)
Troponin I: 0.44 ng/mL (ref <0.03)

## 2018-03-09 LAB — CBC
HCT: 27.7 % — ABNORMAL LOW (ref 40.0–52.0)
HEMOGLOBIN: 9.6 g/dL — AB (ref 13.0–18.0)
MCH: 31 pg (ref 26.0–34.0)
MCHC: 34.5 g/dL (ref 32.0–36.0)
MCV: 89.9 fL (ref 80.0–100.0)
Platelets: 137 10*3/uL — ABNORMAL LOW (ref 150–440)
RBC: 3.08 MIL/uL — AB (ref 4.40–5.90)
RDW: 14 % (ref 11.5–14.5)
WBC: 7.7 10*3/uL (ref 3.8–10.6)

## 2018-03-09 LAB — PREALBUMIN: PREALBUMIN: 14.1 mg/dL — AB (ref 18–38)

## 2018-03-09 LAB — PHOSPHORUS: PHOSPHORUS: 3.8 mg/dL (ref 2.5–4.6)

## 2018-03-09 LAB — TYPE AND SCREEN
ABO/RH(D): O POS
Antibody Screen: NEGATIVE

## 2018-03-09 LAB — MAGNESIUM: MAGNESIUM: 1.7 mg/dL (ref 1.7–2.4)

## 2018-03-09 MED ORDER — HEPARIN SODIUM (PORCINE) 5000 UNIT/ML IJ SOLN: 5000.0000 [IU] | Freq: Three times a day (TID) | INTRAMUSCULAR | Status: DC

## 2018-03-09 MED ORDER — BISACODYL 5 MG PO TBEC
5.0000 mg | DELAYED_RELEASE_TABLET | Freq: Every day | ORAL | Status: DC | PRN
  Administered 2018-03-09: 5 mg via ORAL
  Filled 2018-03-09: qty 1

## 2018-03-09 MED ORDER — SODIUM CHLORIDE 0.9 % IV BOLUS
250.0000 mL | Freq: Once | INTRAVENOUS | Status: AC
  Administered 2018-03-09: 250 mL via INTRAVENOUS

## 2018-03-09 MED ORDER — NITROGLYCERIN 0.4 MG SL SUBL: 0.4000 mg | SUBLINGUAL_TABLET | SUBLINGUAL | Status: DC | PRN

## 2018-03-09 MED ORDER — NITROGLYCERIN 2 % TD OINT
TOPICAL_OINTMENT | TRANSDERMAL | Status: AC
  Administered 2018-03-09: 0.5 [in_us] via TOPICAL
  Filled 2018-03-09: qty 1

## 2018-03-09 MED ORDER — PANTOPRAZOLE SODIUM 40 MG PO TBEC
40.0000 mg | DELAYED_RELEASE_TABLET | Freq: Every day | ORAL | Status: DC
  Filled 2018-03-09: qty 1

## 2018-03-09 MED ORDER — ISOSORBIDE MONONITRATE ER 30 MG PO TB24
30.0000 mg | ORAL_TABLET | Freq: Every day | ORAL | Status: DC
  Filled 2018-03-09: qty 1

## 2018-03-09 MED ORDER — CARVEDILOL 3.125 MG PO TABS
3.1250 mg | ORAL_TABLET | Freq: Two times a day (BID) | ORAL | Status: DC
  Administered 2018-03-09: 3.125 mg via ORAL
  Filled 2018-03-09: qty 1

## 2018-03-09 MED ORDER — SENNOSIDES-DOCUSATE SODIUM 8.6-50 MG PO TABS
1.0000 | ORAL_TABLET | Freq: Every evening | ORAL | Status: DC | PRN
  Administered 2018-03-09: 1 via ORAL
  Filled 2018-03-09: qty 1

## 2018-03-09 MED ORDER — ACETAMINOPHEN 325 MG PO TABS: 650.0000 mg | ORAL_TABLET | ORAL | Status: DC | PRN

## 2018-03-09 MED ORDER — ATORVASTATIN CALCIUM 20 MG PO TABS: 80.0000 mg | ORAL_TABLET | Freq: Every day | ORAL | Status: DC

## 2018-03-09 MED ORDER — OCUVITE-LUTEIN PO CAPS
1.0000 | ORAL_CAPSULE | Freq: Two times a day (BID) | ORAL | Status: DC
  Administered 2018-03-09: 1 via ORAL
  Filled 2018-03-09 (×2): qty 1

## 2018-03-09 MED ORDER — NITROGLYCERIN 2 % TD OINT
0.5000 [in_us] | TOPICAL_OINTMENT | Freq: Once | TRANSDERMAL | Status: AC
  Administered 2018-03-09: 0.5 [in_us] via TOPICAL

## 2018-03-09 MED ORDER — VITAMIN B-12 1000 MCG PO TABS
1000.0000 ug | ORAL_TABLET | Freq: Every day | ORAL | Status: DC
  Administered 2018-03-09: 1000 ug via ORAL
  Filled 2018-03-09: qty 1

## 2018-03-09 MED ORDER — MORPHINE SULFATE (PF) 2 MG/ML IV SOLN: 2.0000 mg | INTRAVENOUS | Status: DC | PRN

## 2018-03-09 MED ORDER — ATORVASTATIN CALCIUM 20 MG PO TABS: 40.0000 mg | ORAL_TABLET | Freq: Every day | ORAL | Status: DC

## 2018-03-09 MED ORDER — ONDANSETRON HCL 4 MG/2ML IJ SOLN: 4.0000 mg | Freq: Four times a day (QID) | INTRAMUSCULAR | Status: DC | PRN

## 2018-03-09 MED ORDER — TAMSULOSIN HCL 0.4 MG PO CAPS
0.4000 mg | ORAL_CAPSULE | Freq: Every day | ORAL | Status: DC
  Administered 2018-03-09: 0.4 mg via ORAL
  Filled 2018-03-09: qty 1

## 2018-03-09 MED ORDER — ASPIRIN 81 MG PO CHEW
CHEWABLE_TABLET | ORAL | Status: AC
  Filled 2018-03-09: qty 4

## 2018-03-10 LAB — ECHOCARDIOGRAM COMPLETE
Height: 67 in
Weight: 2342.4 oz

## 2018-03-11 LAB — CALCIUM, IONIZED: Calcium, Ionized, Serum: 4.5 mg/dL (ref 4.5–5.6)

## 2018-03-25 ENCOUNTER — Ambulatory Visit: Payer: Medicare Other | Admitting: Gastroenterology

## 2018-03-26 NOTE — Progress Notes (Signed)
  Patient ID: Kevin Tyler, male   DOB: 04/12/1918, 84 y.o.   MRN: [id number] Pt was admitted in the early hours of the morning with chest discomfort and elevated troponin. His vitals were overall stbale with soft BP. He was discharged few days ago after having diverticular bleed. Pt walked to the bathroom to have a BM and nurses were present with him. He suddenly slumped over and collapsed. Pt was DNR Rolled to the bed and started on IVF. Initially had feeble pulse however lost pulse and pt expired at 12:25 pm. Daughter was in the room. Chaplain was called. Nursing supervisor int he room  Pt was DNR

## 2018-03-26 NOTE — Progress Notes (Signed)
*  PRELIMINARY RESULTS* Echocardiogram 2D Echocardiogram has been performed.  Kevin Tyler 04/01/18, 9:49 AM

## 2018-03-26 NOTE — ED Notes (Signed)
Admitting MD at bedside.

## 2018-03-26 NOTE — ED Triage Notes (Signed)
Patient c/o chest pain with exertion.

## 2018-03-26 NOTE — Death Summary Note (Signed)
DEATH SUMMARY   Patient Details  Name: Kevin Tyler MRN: [id number] DOB: [birth year]  Admission/Discharge Information   Admit Date:  03-12-18  Date of Death:  March 12, 2018  Time of Death:  12:25 pm  Length of Stay: 0  Referring Physician: Baxter Hire, MD   Reason(s) for Hospitalization  Chest discomfort/pain  Diagnoses  Unstable angina/NSTEMI  Brief Hospital Course (including significant findings, care, treatment, and services provided and events leading to death)  Kevin Tyler is a 84 y.o. year old male who has history of MI, macular degeneration, coronary artery disease, COPD and history of aortic stenosis along with recent history of diverticular G.I. bleed status post microbeads embolization of inferior mesenteric artery few days ago comes to the emergency room with complaints of chest discomfort since yesterday afternoon. Patient was admitted to the hospital. EKG showed severe changes of LVH. Rhythm and PACs patient was admitted to the medical floor started on nitroglycerin paste along with cardiac meds. His pressure was bit on the softer side house however he was asymptomatic. Patient denied any abdominal pain and chest pain during my evaluation in the morning. He went walked to the bathroom along with the nurses to have a bowel movement and thereafter suddenly slumped over. She did not fall. He was held by the nurse. He was just back on the bed. He had very febrile poles. IV fluids were given a wide open. Patient lost his pulse right away and since he was a DNR spoke with patient's daughter was at bedside. Exact etiology not establish however possibility of unstable angina/acute mild non-QA of MI was suspected. Chaplain was informed.    Pertinent Labs and Studies  Significant Diagnostic Studies Ct Abdomen Pelvis W Contrast  Result Date: 03/04/2018 CLINICAL DATA:  Bloody stools. EXAM: CT ABDOMEN AND PELVIS WITH CONTRAST TECHNIQUE: Multidetector CT imaging of the  abdomen and pelvis was performed using the standard protocol following bolus administration of intravenous contrast. CONTRAST:  29mL ISOVUE-300 IOPAMIDOL (ISOVUE-300) INJECTION 61% COMPARISON:  None. FINDINGS: Lower chest: Heart is enlarged. Coronary artery calcification is evident. Atherosclerotic calcification is noted in the wall of the thoracic aorta. Hepatobiliary: Hepatic cysts measure up to 3.1 cm diameter. There is no evidence for gallstones, gallbladder wall thickening, or pericholecystic fluid. No intrahepatic or extrahepatic biliary dilation. Extrahepatic bile duct measures 8 mm diameter, within normal limits for age. Patient is noted to have a 7 x 7 x 10 mm stone in the distal common bile duct (2:37 and well demonstrated coronal image 40 series 5). Pancreas: 12 mm cystic lesion identified in the tail of pancreas (5:57). No dilatation of the main duct. Spleen: No splenomegaly. No focal mass lesion. Adrenals/Urinary Tract: No adrenal nodule or mass. 11 mm exophytic lesion interpolar left kidney has attenuation too high to be a simple cyst. Similar 7 mm lesion identified interpolar right kidney. No evidence for hydroureter. Small left-sided bladder diverticulum evident. Stomach/Bowel: Small hiatal hernia. Stomach otherwise unremarkable. Duodenum is normally positioned as is the ligament of Treitz. No small bowel wall thickening. No small bowel dilatation. The terminal ileum is normal. The appendix is normal. Advanced diverticular changes noted left colon without definite findings of diverticulitis. Vascular/Lymphatic: There is abdominal aortic atherosclerosis without aneurysm. There is no gastrohepatic or hepatoduodenal ligament lymphadenopathy. No intraperitoneal or retroperitoneal lymphadenopathy. No pelvic sidewall lymphadenopathy. Reproductive: Brachytherapy seeds noted prostate Other: No intraperitoneal free fluid. Musculoskeletal: No worrisome lytic or sclerotic osseous abnormality. Degenerative disc  disease noted lumbar spine. IMPRESSION: 1. 7 x  7 x 10 mm common bile duct stone without substantial biliary dilatation. No evidence for cholelithiasis. 2. 12 mm cystic lesion tail of pancreas. In a patient of this age with a cyst of this size, consensus criteria recommend reimaging every 2 years x2. Repeat CT abdomen with contrast in 12 months recommended. This recommendation follows ACR consensus guidelines: Management of Incidental Pancreatic Cysts: A White Paper of the ACR Incidental Findings Committee. J Am Coll Radiol 7989;21:194-174. 3. Advanced diverticular disease in the left colon without overt features of diverticulitis. 4. 11 mm exophytic lesion interpolar left kidney with attenuation too high to be a simple cyst. While this may be a cyst complicated by proteinaceous debris or hemorrhage, renal neoplasm could have similar CT imaging characteristics. Repeat imaging in 6 months could be used to ensure stability. 5.  Aortic Atherosclerois (ICD10-170.0) Electronically Signed   By: Misty Stanley M.D.   On: 03/04/2018 07:59   US Venous Img Lower Bilateral  Result Date: 03/04/2018 CLINICAL DATA:  84 year old male with 2 days of bilateral lower extremity pain EXAM: BILATERAL LOWER EXTREMITY VENOUS DOPPLER ULTRASOUND TECHNIQUE: Gray-scale sonography with graded compression, as well as color Doppler and duplex ultrasound were performed to evaluate the lower extremity deep venous systems from the level of the common femoral vein and including the common femoral, femoral, profunda femoral, popliteal and calf veins including the posterior tibial, peroneal and gastrocnemius veins when visible. The superficial great saphenous vein was also interrogated. Spectral Doppler was utilized to evaluate flow at rest and with distal augmentation maneuvers in the common femoral, femoral and popliteal veins. COMPARISON:  None. FINDINGS: RIGHT LOWER EXTREMITY Common Femoral Vein: No evidence of thrombus. Normal compressibility,  respiratory phasicity and response to augmentation. Saphenofemoral Junction: No evidence of thrombus. Normal compressibility and flow on color Doppler imaging. Profunda Femoral Vein: No evidence of thrombus. Normal compressibility and flow on color Doppler imaging. Femoral Vein: No evidence of thrombus. Normal compressibility, respiratory phasicity and response to augmentation. Popliteal Vein: No evidence of thrombus. Normal compressibility, respiratory phasicity and response to augmentation. Calf Veins: No evidence of thrombus. Normal compressibility and flow on color Doppler imaging. Superficial Great Saphenous Vein: No evidence of thrombus. Normal compressibility. Venous Reflux:  None. Other Findings:  None. LEFT LOWER EXTREMITY Common Femoral Vein: No evidence of thrombus. Normal compressibility, respiratory phasicity and response to augmentation. Saphenofemoral Junction: No evidence of thrombus. Normal compressibility and flow on color Doppler imaging. Profunda Femoral Vein: No evidence of thrombus. Normal compressibility and flow on color Doppler imaging. Femoral Vein: No evidence of thrombus. Normal compressibility, respiratory phasicity and response to augmentation. Popliteal Vein: No evidence of thrombus. Normal compressibility, respiratory phasicity and response to augmentation. Calf Veins: No evidence of thrombus. Normal compressibility and flow on color Doppler imaging. Superficial Great Saphenous Vein: No evidence of thrombus. Normal compressibility. Venous Reflux:  None. Other Findings:  None. IMPRESSION: No evidence of deep venous thrombosis in either lower extremity. Electronically Signed   By: Jacqulynn Cadet M.D.   On: 03/04/2018 14:16   Dg Chest Port 1 View  Result Date: Mar 11, 2018 CLINICAL DATA:  Chest pain and weakness today EXAM: PORTABLE CHEST 1 VIEW COMPARISON:  10/18/2017 FINDINGS: Midline trachea. Mild cardiomegaly. Prior median sternotomy. Atherosclerosis in the transverse aorta. No  pleural effusion or pneumothorax. Low lung volumes with resultant pulmonary interstitial prominence. Mild left base scarring or subsegmental atelectasis. IMPRESSION: No acute cardiopulmonary disease. Cardiomegaly without congestive failure. Electronically Signed   By: Abigail Miyamoto M.D.   On: 03-11-2018  02:26    Microbiology Recent Results (from the past 240 hour(s))  MRSA PCR Screening     Status: None   Collection Time: 03/04/18  9:38 AM  Result Value Ref Range Status   MRSA by PCR NEGATIVE NEGATIVE Final    Comment:        The GeneXpert MRSA Assay (FDA approved for NASAL specimens only), is one component of a comprehensive MRSA colonization surveillance program. It is not intended to diagnose MRSA infection nor to guide or monitor treatment for MRSA infections. Performed at [facility], [address] [place], [place] [address]     Lab Basic Metabolic Panel: Recent Labs  Lab 03/04/18 [phone] 03/06/18 0437 2018-03-13 0213 03/13/18 0508  NA 138 140 139  --   K 4.6 4.0 4.1  --   CL 109 112* 108  --   CO2 21* 21* 24  --   GLUCOSE 186* 78 124*  --   BUN 34* 15 18  --   CREATININE 1.47* 1.18 1.30*  --   CALCIUM 8.3* 8.3* 8.4*  --   MG  --   --   --  1.7  PHOS  --   --   --  3.8   Liver Function Tests: Recent Labs  Lab 03/04/18 0632 03-13-2018 0213  AST 16 21  ALT 14 15  ALKPHOS 67 71  BILITOT 0.8 0.5  PROT 5.6* 6.3*  ALBUMIN 3.2* 3.4*   No results for input(s): LIPASE, AMYLASE in the last 168 hours. No results for input(s): AMMONIA in the last 168 hours. CBC: Recent Labs  Lab 03/04/18 0632 03/04/18 1624 03/05/18 0135  03/05/18 2022 03/06/18 0046 03/06/18 0437 03/06/18 0828 13-Mar-2018 0213  WBC 8.8  --  8.2  --   --   --  7.9  --  7.7  NEUTROABS 6.2  --   --   --   --   --   --   --   --   HGB 8.9* 10.8*  11.0* 10.7*   < > 10.7* 9.1* 9.6* 9.7* 9.6*  HCT 25.7* 31.1* 30.7*  --   --   --  28.2*  --  27.7*  MCV 89.4  --  89.0  --   --   --  90.1  --   89.9  PLT 125*  --  106*  --   --   --  104*  --  137*   < > = values in this interval not displayed.   Cardiac Enzymes: Recent Labs  Lab 03/04/18 0632 2018-03-13 0213 Mar 13, 2018 0627 March 13, 2018 0901  TROPONINI <0.03 0.20* 0.29* 0.44*   Sepsis Labs: Recent Labs  Lab 03/04/18 6834 03/04/18 0707 03/04/18 0950 03/05/18 0135 03/06/18 0437 2018-03-13 0213  WBC 8.8  --   --  8.2 7.9 7.7  LATICACIDVEN  --  2.2* 1.5  --   --   --        Fritzi Mandes 03-13-2018, 3:04 PM

## 2018-03-26 NOTE — Progress Notes (Signed)
   03-15-18 1300  Clinical Encounter Type  Visited With Patient and family together  Visit Type Initial  Referral From Physician  Consult/Referral To Chaplain  Spiritual Encounters  Spiritual Needs Prayer;Emotional;Grief support  Stress Factors  Patient Stress Factors Health changes  Family Stress Factors Major life changes   [facility] alerted via PG, that a code blue had been initiated on a patient whom is DNR, the code was cancelled but the patient expired. In attendance was his daughter and the daughters neighbor. The [place] presented to the room for pastoral care, and emotional support. The [place] presented to the room the physician , Posey Pronto MD was present and comforting the daughter, pursuant to the passing of her father. The patient lye in the bed, with the [facility]) and the patient's neighbor also in the room. I identified myself as the [facility], pastoral presence ensued. I stood initiating silent prayer and offering the ministry of hospitality. After the MD left I stood next to the bed praying over the deceased and was later joined by the daughter at the bedside comforting her while she was grieving. I stood in this space while the daughter spoke with family members and decided upon the arrangements. I later escorted the daughter and friend to the elevator bestowing upon them words of solace, and peace during this period of their grieving. Salutations were conferred upon them for comfort prior to our departing.

## 2018-03-26 NOTE — ED Triage Notes (Signed)
Patient called out EMS from home for left arm pain and weakness. Patient AO X 4.

## 2018-03-26 NOTE — H&P (Addendum)
[place] [facility] [place] NAME: Kevin Tyler    MR#:  [id number]  DATE OF BIRTH:  04-02-1918  DATE OF ADMISSION:  2018-03-29  PRIMARY CARE PHYSICIAN: Baxter Hire, MD   REQUESTING/REFERRING PHYSICIAN: Nena Polio, MD  CHIEF COMPLAINT:   Chief Complaint  Patient presents with  . Weakness    HISTORY OF PRESENT ILLNESS:  Kevin Tyler  is a 84 y.o. male with a known history of severe aortic stenosis (as of 02/29/2016 Echo) p/w CP. Pt sees Dr. Ubaldo Glassing of Cardiology as an outpt. Pt is AAOx3, but is severely hard of hearing, and did not bring his hearing aids to the hospital. Hx and ROS are limited. Pt states he has been off Aspirin for ~57yrs, after having had a retinal hemorrhage. He tells me that he was in his usual state of health until Friday 09/13. He states he was walking to the bathroom in the evening, and developed some midchest tightness/heaviness after walking approximately 8ft. He states he stopped and rested, and the discomfort resolved. He used the bathroom, and then started to walk back out, and developed the same discomfort, again resolving with rest. He decided to go to bed and lie down. He states he developed the same discomfort while walking to the bed. He states he lied down. The discomfort did not improve. He took an 81mg  Aspirin. There was no improvement. He denies N/V, palpitations, diaphoresis, but does endorse having had some lightheadedness. EMS was called. He denies active CP at the time of my assessment. EKG (+) ST depressions in V4-V6. Trop-I 0.20. He is comfortable, well-appearing and in no acute distress.  Of note, pt had a recent hospitalization from 03/04/2018 to 03/06/2018 for a large-volume lower GI bleed, source sigmoid diverticulosis, s/p IR embolization. Vascular access was obtained via R femoral artery. Hgb 9.7 on D/C (09/11), stable @ 9.6 on present admission. He is functional, independent, and lives at home  alone at baseline, but has been staying with family since his recent hospitalization.   PAST MEDICAL HISTORY:   Past Medical History:  Diagnosis Date  . Aortic stenosis   . Basal cell carcinoma (BCC) of upper back   . COPD (chronic obstructive pulmonary disease) ([place])   . Coronary artery disease   . GERD (gastroesophageal reflux disease)   . Hypertension   . Macular degeneration   . Myocardial infarct ([place])   . Prostate cancer ([place])   . Retinal hemorrhage     PAST SURGICAL HISTORY:   Past Surgical History:  Procedure Laterality Date  . CATARACT EXTRACTION    . CATARACT EXTRACTION W/ INTRAOCULAR LENS IMPLANT    . COLONOSCOPY N/A 03/05/2018   Procedure: COLONOSCOPY;  Surgeon: Lin Landsman, MD;  Location: [facility] ENDOSCOPY;  Service: Gastroenterology;  Laterality: N/A;  . CORONARY ARTERY BYPASS GRAFT    . ESOPHAGOGASTRODUODENOSCOPY N/A 03/05/2018   Procedure: ESOPHAGOGASTRODUODENOSCOPY (EGD);  Surgeon: Lin Landsman, MD;  Location: [facility] ENDOSCOPY;  Service: Gastroenterology;  Laterality: N/A;  . EYE SURGERY    . PROSTATECTOMY    . PROSTATECTOMY    . VISCERAL ARTERY INTERVENTION N/A 03/05/2018   Procedure: VISCERAL ARTERY INTERVENTION;  Surgeon: Katha Cabal, MD;  Location: [facility] LAB;  Service: Cardiovascular;  Laterality: N/A;    SOCIAL HISTORY:   Social History   Tobacco Use  . Smoking status: Never Smoker  . Smokeless tobacco: Never Used  Substance Use Topics  . Alcohol use: Not  Currently    FAMILY HISTORY:  History reviewed. No pertinent family history.  DRUG ALLERGIES:   Allergies  Allergen Reactions  . Aspirin     "it was showing my retina was rupturing"    REVIEW OF SYSTEMS:   Review of Systems  Constitutional: Negative for chills, diaphoresis, fever, malaise/fatigue and weight loss.  HENT: Positive for hearing loss. Negative for congestion, ear pain, nosebleeds, sinus pain, sore throat and tinnitus.   Eyes: Negative for double  vision and photophobia.  Respiratory: Negative for cough, hemoptysis, sputum production, shortness of breath and wheezing.   Cardiovascular: Positive for chest pain. Negative for palpitations, orthopnea, claudication, leg swelling and PND.  Gastrointestinal: Negative for abdominal pain, blood in stool, constipation, diarrhea, heartburn, melena, nausea and vomiting.  Genitourinary: Negative for dysuria, frequency, hematuria and urgency.  Musculoskeletal: Negative for back pain, joint pain, myalgias and neck pain.  Skin: Negative for itching and rash.  Neurological: Positive for dizziness (+) lightheadedness. Negative for tingling, tremors, sensory change, speech change, focal weakness, seizures, loss of consciousness, weakness and headaches.  Psychiatric/Behavioral: Negative for memory loss. The patient does not have insomnia.    MEDICATIONS AT HOME:   Prior to Admission medications   Medication Sig Start Date End Date Taking? Authorizing Provider  Multiple Vitamins-Minerals (PRESERVISION AREDS 2+MULTI VIT) CAPS Take 1 capsule by mouth 2 (two) times daily.   Yes [provider]  omeprazole (PRILOSEC) 40 MG capsule Take 1 capsule (40 mg total) by mouth daily. 03/05/18  Yes Mody, Ulice Bold, MD  simvastatin (ZOCOR) 20 MG tablet Take 1 tablet by mouth every evening. 09/25/17  Yes [provider]  tamsulosin (FLOMAX) 0.4 MG CAPS capsule Take 1 capsule by mouth. 30 minutes after the same daily meal 02/22/17  Yes [provider]  vitamin B-12 (CYANOCOBALAMIN) 1000 MCG tablet Take 1 tablet (1,000 mcg total) by mouth daily. 03/05/18  Yes Mody, Ulice Bold, MD      VITAL SIGNS:  Blood pressure 116/61, pulse 86, temperature 98.6 F (37 C), temperature source Oral, resp. rate 14, height 5\' 7"  (1.702 m), weight 66.4 kg, SpO2 100 %.  PHYSICAL EXAMINATION:  Physical Exam  Constitutional: He is oriented to person, place, and time. He appears well-developed and well-nourished. He is active and  cooperative.  Non-toxic appearance. He does not have a sickly appearance. He does not appear ill. No distress. He is not intubated.  HENT:  Head: Normocephalic and atraumatic.  Mouth/Throat: Oropharynx is clear and moist. No oropharyngeal exudate.  Eyes: Conjunctivae, EOM and lids are normal. No scleral icterus.  Neck: Neck supple. No JVD present. No thyromegaly present.  Cardiovascular: Normal rate, regular rhythm, S1 normal and S2 normal.  No extrasystoles are present. Exam reveals no gallop, no S3, no S4, no distant heart sounds and no friction rub.  Murmur heard.  Systolic murmur is present with a grade of 2/6. Pulmonary/Chest: Effort normal and breath sounds normal. No accessory muscle usage or stridor. No apnea, no tachypnea and no bradypnea. He is not intubated. No respiratory distress. He has no decreased breath sounds. He has no wheezes. He has no rhonchi. He has no rales.  Abdominal: Soft. Bowel sounds are normal. He exhibits no distension. There is no tenderness. There is no rigidity, no rebound and no guarding.  Musculoskeletal: Normal range of motion. He exhibits no edema or tenderness.  Lymphadenopathy:    He has no cervical adenopathy.  Neurological: He is alert and oriented to person, place, and time. He is not  disoriented.  (+) HOH.  Skin: Skin is warm and dry. He is not diaphoretic. No erythema. No pallor.  Psychiatric: He has a normal mood and affect. His speech is normal and behavior is normal. Judgment and thought content normal. Cognition and memory are normal.   LABORATORY PANEL:   CBC Recent Labs  Lab 03/17/18 0213  WBC 7.7  HGB 9.6*  HCT 27.7*  PLT 137*   ------------------------------------------------------------------------------------------------------------------  Chemistries  Recent Labs  Lab 03/17/18 0213  NA 139  K 4.1  CL 108  CO2 24  GLUCOSE 124*  BUN 18  CREATININE 1.30*  CALCIUM 8.4*  AST 21  ALT 15  ALKPHOS 71  BILITOT 0.5    ------------------------------------------------------------------------------------------------------------------  Cardiac Enzymes Recent Labs  Lab 2018-03-17 0213  TROPONINI 0.20*   ------------------------------------------------------------------------------------------------------------------  RADIOLOGY:  Dg Chest Port 1 View  Result Date: 03/17/2018 CLINICAL DATA:  Chest pain and weakness today EXAM: PORTABLE CHEST 1 VIEW COMPARISON:  10/18/2017 FINDINGS: Midline trachea. Mild cardiomegaly. Prior median sternotomy. Atherosclerosis in the transverse aorta. No pleural effusion or pneumothorax. Low lung volumes with resultant pulmonary interstitial prominence. Mild left base scarring or subsegmental atelectasis. IMPRESSION: No acute cardiopulmonary disease. Cardiomegaly without congestive failure. Electronically Signed   By: Abigail Miyamoto M.D.   On: March 17, 2018 02:26   IMPRESSION AND PLAN:   A/P: 4M w/ Hx recent diverticular bleed, Hx retinal hemorrhage, Hx severe AS p/w CP/unstable angina/NSTEMI. Hyperglycemia, Cr elevation/CKD III-IV, hypocalcemia, hypoalbuminemia/hypoproteinemia, Troponin elevation, normocytic anemia. -CP, unstable angina, NSTEMI, Troponin elevation: Pt p/w CP, narrative concerning for unstable angina. Follows w/ Dr. Ubaldo Glassing outpt. Off ASA x54yrs 2/2 retinal hemorrhage. Recent admit (09/09-09/11) for sigmoid diverticular bleed, s/p pRBC txfn, s/p EGD/colonoscopy, s/p IR embolization. Hgb stable since prior D/C, 9.6 on admission (goal Hgb 8.0+ in setting of ACS). EKG (+) ST depressions V4-V6, (-) STEMI. Trop-I 0.20, rpt pending. Pt took ASA81 @ home. Given Hx of retinal hemorrhage, he is not ordered for further Aspirin. Case was d/w Dr. Clayborn Bigness by ED provider, pt will not be heparinized. Nitropaste in ED. Imdur, statin. Tele, cardiac monitoring. O2, morphine. Cardiology consult. -Severe AS: Pt w/ severe AS as of 02/2016 Echo, rpt Echo pending. Constitutes potential etiology for  angina, though presentation is concerning for ASCVD (discussed above). Cardiology consult. -Cr elevation/CKD III: Cr 1.30 on present admission, baseline CKD III (2/2 HTN, aged kidney), baseline Cr 1.1-1.4. Pt presently at baseline, monitor BMP, avoid nephrotoxins. -Hypocalcemia: Ionized calcium. -Hypoproteinemia/hypoalbuminemia: Prealbumin. -Normocytic anemia: Likely acute blood loss anemia (2/2 recent diverticular bleed). No antiplatelet agents or systemic anticoagulation ordered at present time, as discussed above. -c/w other home meds. -FEN/GI: NPO for now. -DVT PPx: SCDs, no pharmacological DVT PPx (recent bleed). -Code status: DNR/DNI at pt request. -Disposition: Admission, > 2 midnights.    All the records are reviewed and case discussed with ED provider. Management plans discussed with the patient, family and they are in agreement.  CODE STATUS: DNR/DNI.  TOTAL TIME TAKING CARE OF THIS PATIENT: 90 minutes.    Arta Silence M.D on March 17, 2018 at 5:15 AM  Between 7am to 6pm - Pager - [phone]  After 6pm go to [email or web address] - Proofreader  [employer] [facility]  Office  [phone]  CC: Primary care physician; Baxter Hire, MD   Note: This dictation was prepared with Dragon dictation along with smaller phrase technology. Any transcriptional errors that result from this process are unintentional.

## 2018-03-26 NOTE — ED Provider Notes (Signed)
[facility] Emergency Department Provider Note   ____________________________________________   First MD Initiated Contact with Patient 04-01-18 [phone]     (approximate)  I have reviewed the triage vital signs and the nursing notes.   HISTORY  Chief Complaint Weakness    HPI Kevin Tyler is a 84 y.o. male patient with recent hospitalization for hemorrhagic shock and GI bleed this month and a history of retinal hemorrhage unable to take aspirin comes in reporting that he had chest heaviness with walking about 20 feet resolve with rest recurred again.  The third time it did not seem to resolve right away until he got to the emergency room.  He also had some arm pain associated with it.  Patient has a history of aortic stenosis.  Pain is moderate in severity tight resolved after 5 to 10 minutes of rest the first 2 times third time seem to last at least half an hour.   Past Medical History:  Diagnosis Date  . Aortic stenosis   . Basal cell carcinoma (BCC) of upper back   . COPD (chronic obstructive pulmonary disease) ([place])   . Coronary artery disease   . GERD (gastroesophageal reflux disease)   . Hypertension   . Macular degeneration   . Myocardial infarct ([place])   . Prostate cancer ([place])   . Retinal hemorrhage     Patient Active Problem List   Diagnosis Date Noted  . GI bleed 03/05/2018  . GIB (gastrointestinal bleeding) 03/04/2018  . History of secondary intraocular lens implant 09/13/2015  . Exudative age-related macular degeneration, right eye, with active choroidal neovascularization ([place]) 07/29/2015  . Nonexudative age-related macular degeneration, left eye, advanced atrophic with subfoveal involvement 07/29/2015  . Subretinal hemorrhage of right eye 07/29/2015  . Vitreous hemorrhage of right eye ([place]) 07/29/2015  . BPPV (benign paroxysmal positional vertigo) 02/11/2015  . Chronic coronary artery disease 02/03/2015  . Sciatica of right side  05/12/2014  . Anemia, unspecified 01/05/2014  . Carotid artery disease ([place]) 01/05/2014  . History of aortic stenosis 01/05/2014  . Hyperlipidemia, unspecified 01/05/2014  . Hypertension 01/05/2014  . Myocardial infarction ([place]) 01/05/2014  . Renal insufficiency 01/05/2014    Past Surgical History:  Procedure Laterality Date  . CATARACT EXTRACTION    . CATARACT EXTRACTION W/ INTRAOCULAR LENS IMPLANT    . COLONOSCOPY N/A 03/05/2018   Procedure: COLONOSCOPY;  Surgeon: Lin Landsman, MD;  Location: [facility] ENDOSCOPY;  Service: Gastroenterology;  Laterality: N/A;  . CORONARY ARTERY BYPASS GRAFT    . ESOPHAGOGASTRODUODENOSCOPY N/A 03/05/2018   Procedure: ESOPHAGOGASTRODUODENOSCOPY (EGD);  Surgeon: Lin Landsman, MD;  Location: [facility] ENDOSCOPY;  Service: Gastroenterology;  Laterality: N/A;  . EYE SURGERY    . PROSTATECTOMY    . PROSTATECTOMY    . VISCERAL ARTERY INTERVENTION N/A 03/05/2018   Procedure: VISCERAL ARTERY INTERVENTION;  Surgeon: Katha Cabal, MD;  Location: [facility] CV LAB;  Service: Cardiovascular;  Laterality: N/A;    Prior to Admission medications   Medication Sig Start Date End Date Taking? Authorizing Provider  Multiple Vitamins-Minerals (PRESERVISION AREDS 2+MULTI VIT) CAPS Take 1 capsule by mouth 2 (two) times daily.    [provider]  omeprazole (PRILOSEC) 40 MG capsule Take 1 capsule (40 mg total) by mouth daily. 03/05/18   Bettey Costa, MD  simvastatin (ZOCOR) 20 MG tablet Take 1 tablet by mouth every evening. 09/25/17   [provider]  tamsulosin (FLOMAX) 0.4 MG CAPS capsule Take 1 capsule by mouth. [place]  minutes after the same daily meal 02/22/17   [provider]  vitamin B-12 (CYANOCOBALAMIN) 1000 MCG tablet Take 1 tablet (1,000 mcg total) by mouth daily. 03/05/18   Bettey Costa, MD    Allergies Aspirin  No family history on file.  Social History Social History   Tobacco Use  . Smoking status: Never Smoker  .  Smokeless tobacco: Never Used  Substance Use Topics  . Alcohol use: Not Currently  . Drug use: Never    Review of Systems  Constitutional: No fever/chills Eyes: No visual changes. ENT: No sore throat. Cardiovascular:  chest pain. Respiratory: Denies shortness of breath. Gastrointestinal: No abdominal pain.  No nausea, no vomiting.  No diarrhea.  No constipation. Genitourinary: Negative for dysuria. Musculoskeletal: Negative for back pain. Skin: Negative for rash. Neurological: Negative for headaches, focal weakness  ____________________________________________   PHYSICAL EXAM:  VITAL SIGNS: ED Triage Vitals  Enc Vitals Group     BP Mar 24, 2018 0212 136/75     Pulse Rate 24-Mar-2018 0212 85     Resp 03-24-18 0212 17     Temp 03/24/2018 0212 97.8 F (36.6 C)     Temp src --      SpO2 03/24/2018 0212 98 %     Weight 2018/03/24 0202 143 lb 4.8 oz (65 kg)     Height Mar 24, 2018 0202 5\' 7"  (1.702 m)     Head Circumference --      Peak Flow --      Pain Score Mar 24, 2018 0201 2     Pain Loc --      Pain Edu? --      Excl. in [place]? --     Constitutional: Alert and oriented. Well appearing and in no acute distress. Eyes: Conjunctivae are normal.  Head: Atraumatic. Nose: No congestion/rhinnorhea. Mouth/Throat: Mucous membranes are moist.  Oropharynx non-erythematous. Neck: No stridor.   Cardiovascular: Normal rate, regular rhythm. Grossly normal heart sounds.  Good peripheral circulation. Respiratory: Normal respiratory effort.  No retractions. Lungs CTAB. Gastrointestinal: Soft and nontender. No distention. No abdominal bruits. No CVA tenderness. Musculoskeletal: No lower extremity tenderness nor edema.   Neurologic:  Normal speech and language. No gross focal neurologic deficits are appreciated. Skin:  Skin is warm, dry and intact. No rash noted. Psychiatric: Mood and affect are normal. Speech and behavior are normal.  ____________________________________________   LABS (all labs  ordered are listed, but only abnormal results are displayed)  Labs Reviewed  CBC - Abnormal; Notable for the following components:      Result Value   RBC 3.08 (*)    Hemoglobin 9.6 (*)    HCT 27.7 (*)    Platelets 137 (*)    All other components within normal limits  TROPONIN I - Abnormal; Notable for the following components:   Troponin I 0.20 (*)    All other components within normal limits  COMPREHENSIVE METABOLIC PANEL - Abnormal; Notable for the following components:   Glucose, Bld 124 (*)    Creatinine, Ser 1.30 (*)    Calcium 8.4 (*)    Total Protein 6.3 (*)    Albumin 3.4 (*)    GFR calc non Af Amer 44 (*)    GFR calc Af Amer 51 (*)    All other components within normal limits   ____________________________________________  EKG  EKG read and interpreted by me shows normal sinus rhythm rate of 80 normal axis there is ST segment depression in 1 to and F as well as V4  through 6.  There is some ST segment elevation in aVR.  The ST segment elevation in aVR and lead I are old.  The ST segment depression in V2 is new and the ST segment in V4 5 and 6 are is worse than previously. ____________________________________________  RADIOLOGY  ED MD interpretation: Chest x-ray read by radiology reviewed by me shows some cardiomegaly.  Official radiology report(s): Dg Chest Port 1 View  Result Date: 28-Mar-2018 CLINICAL DATA:  Chest pain and weakness today EXAM: PORTABLE CHEST 1 VIEW COMPARISON:  10/18/2017 FINDINGS: Midline trachea. Mild cardiomegaly. Prior median sternotomy. Atherosclerosis in the transverse aorta. No pleural effusion or pneumothorax. Low lung volumes with resultant pulmonary interstitial prominence. Mild left base scarring or subsegmental atelectasis. IMPRESSION: No acute cardiopulmonary disease. Cardiomegaly without congestive failure. Electronically Signed   By: Abigail Miyamoto M.D.   On: 03/28/18 02:26     ____________________________________________   PROCEDURES  Proced  Procedures  Critical Care performed:   ____________________________________________   INITIAL IMPRESSION / ASSESSMENT AND PLAN / ED COURSE  Discussed in detail with Dr.Callwood.  At the present time patient is pain-free.  We will not anticoagulate him we will monitor him closely and keep a nitro on for the time being.         ____________________________________________   FINAL CLINICAL IMPRESSION(S) / ED DIAGNOSES  Final diagnoses:  Elevated troponin  NSTEMI (non-ST elevated myocardial infarction) [facility])     ED Discharge Orders    None       Note:  This document was prepared using Dragon voice recognition software and may include unintentional dictation errors.    Nena Polio, MD 28-Mar-2018 [phone]

## 2018-03-26 NOTE — Progress Notes (Signed)
Summoned to room to assist pt to bathroom and to ambulate him prior to discharge. After he finished on toilet, he complained of feeling weka and dizzy. Slumped over. I called for help and we all got him baack to be. Initially had weak pulse, and then none. Pt's daughter at bedside. Pt pronounced by dr. Gus Height patel. Chaplain spenser called to comfort family.

## 2018-03-26 DEATH — deceased

## 2019-05-05 IMAGING — US US EXTREM LOW VENOUS BILAT
1 series · 13 of 24 positions shown · non-contrast
Comparison: None.

CLINICAL DATA: [AGE] male with 2 days of bilateral lower
extremity pain



[Series 1: us extrem low venous bilat · 0.07mm/px · 13 of 57 slices shown]
[im 1/57]
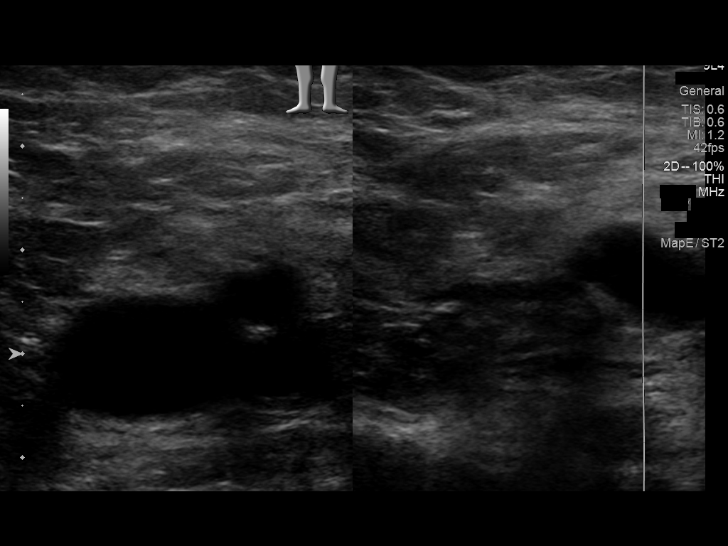
[im 5/57]
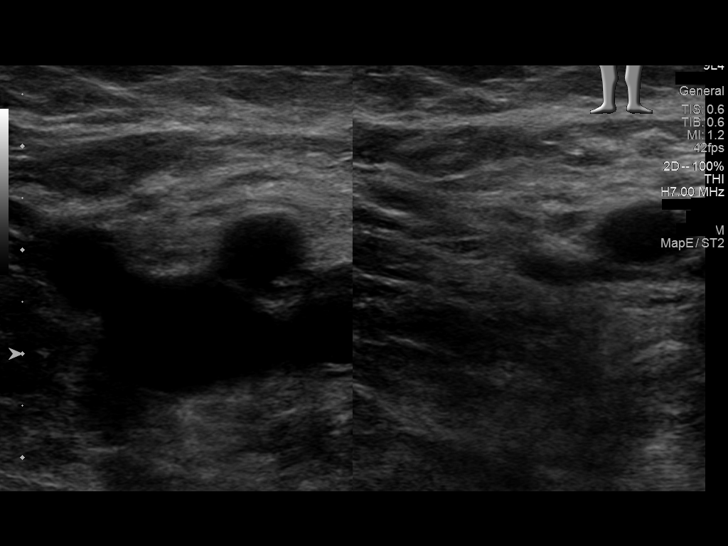
[im 10/57]
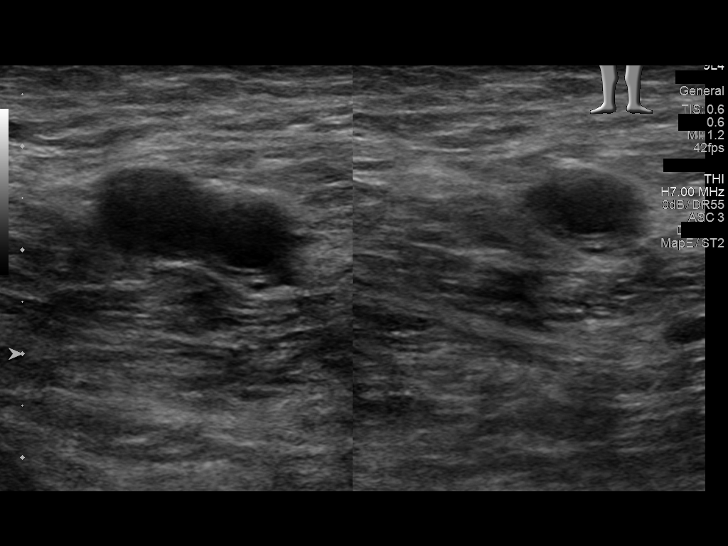
[im 15/57]
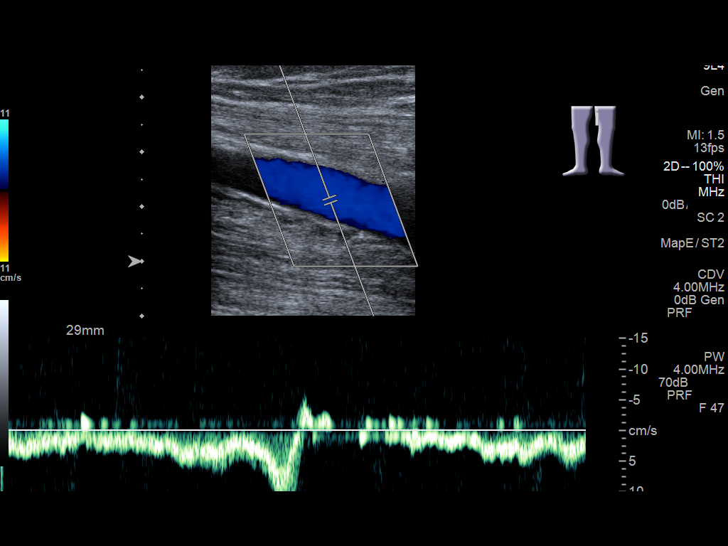
[im 20/57]
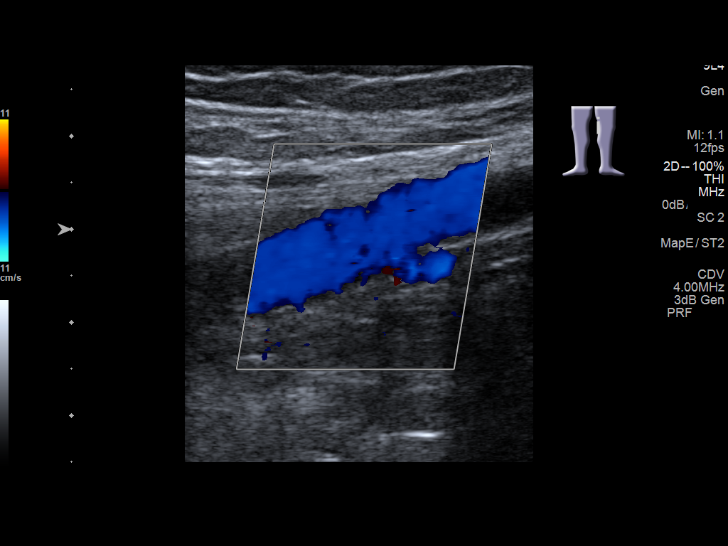
[im 25/57]
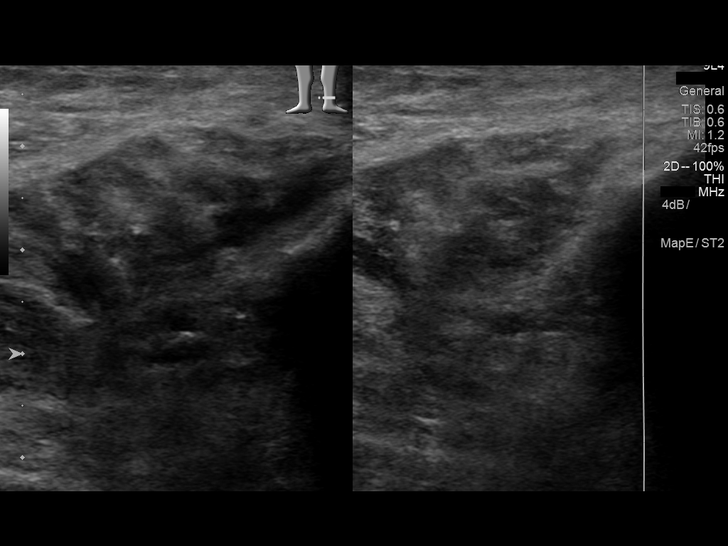
[im 30/57]
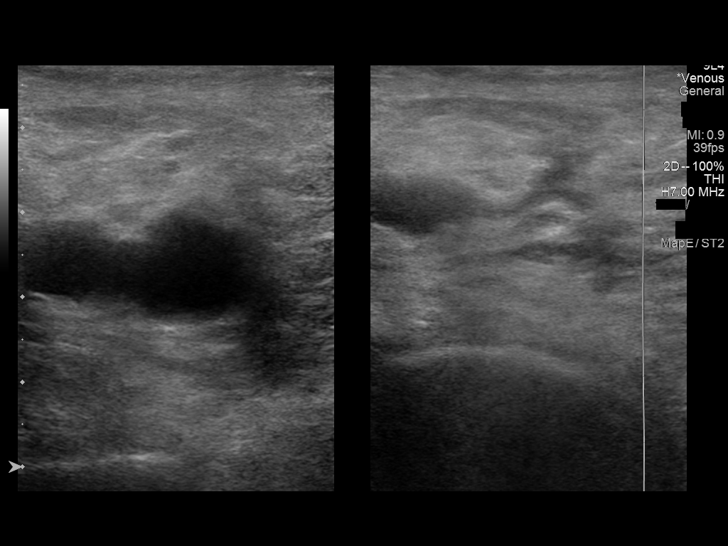
[im 32/57]
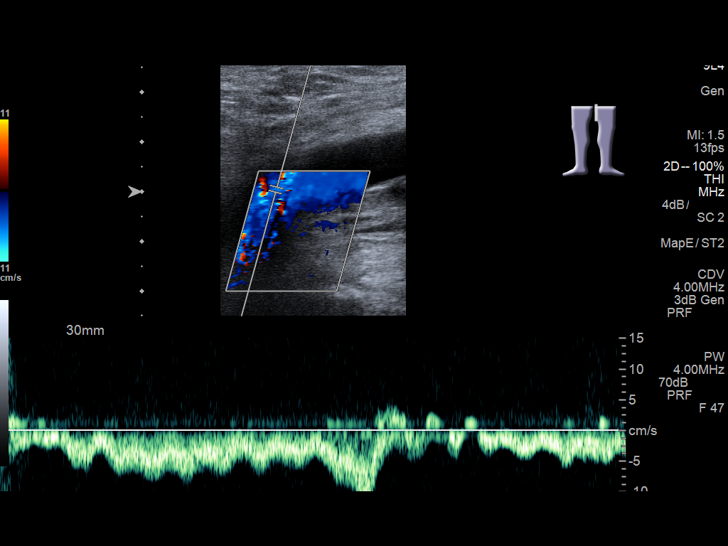
[im 37/57]
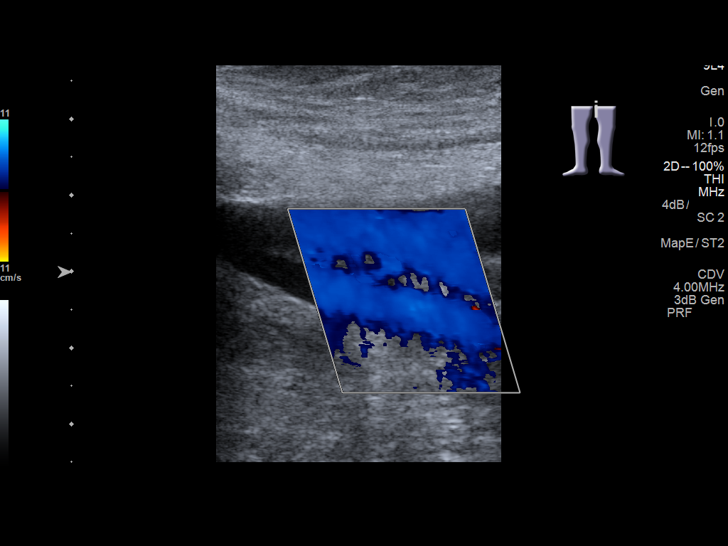
[im 42/57]
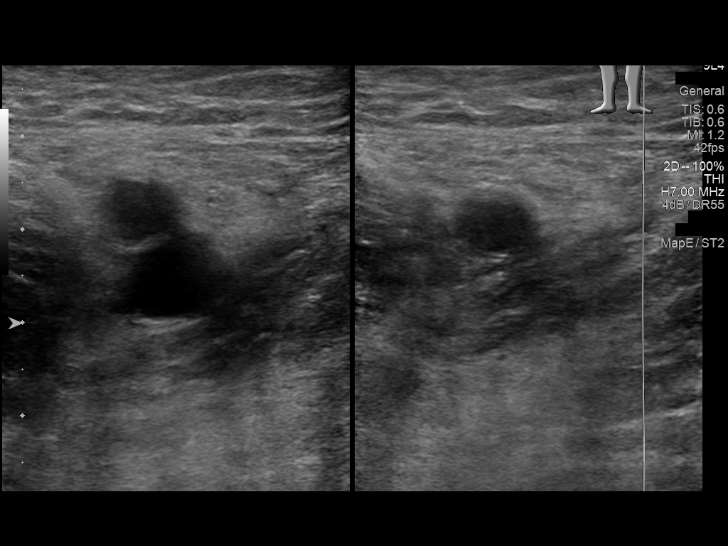
[im 47/57]
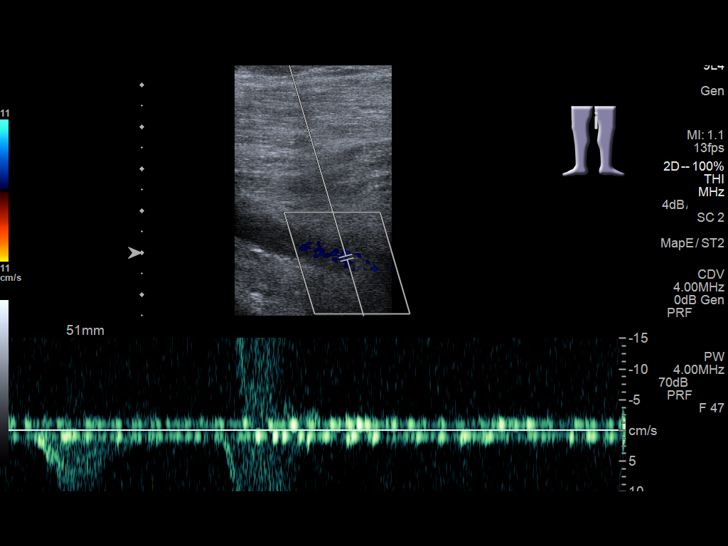
[im 52/57]
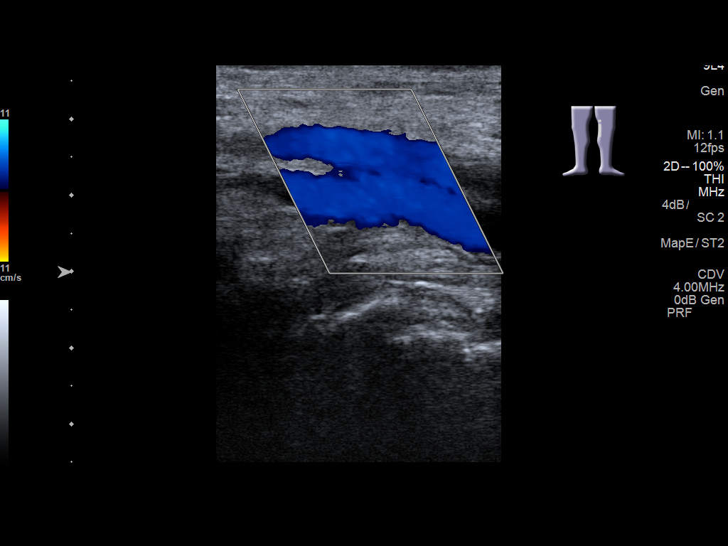
[im 57/57]
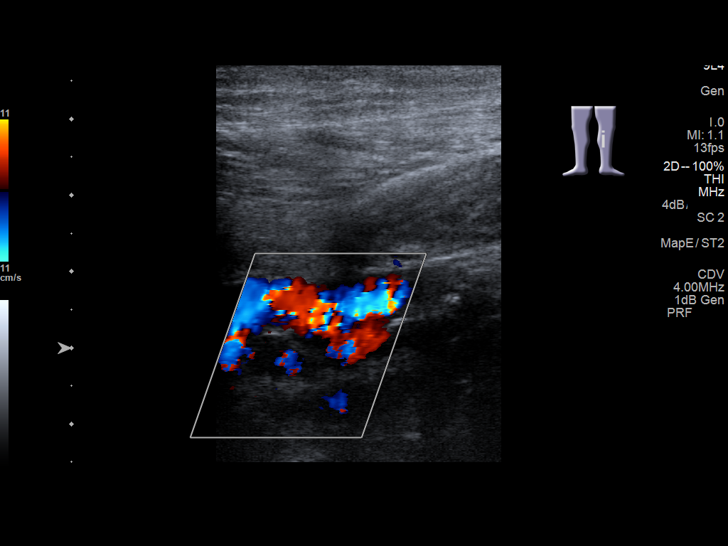

[13 of 24 positions shown; findings below may reference images not displayed]

FINDINGS: RIGHT LOWER EXTREMITY

Common Femoral Vein: No evidence of thrombus. Normal
compressibility, respiratory phasicity and response to augmentation.

Saphenofemoral Junction: No evidence of thrombus. Normal
compressibility and flow on color Doppler imaging.

Profunda Femoral Vein: No evidence of thrombus. Normal
compressibility and flow on color Doppler imaging.

Femoral Vein: No evidence of thrombus. Normal compressibility,
respiratory phasicity and response to augmentation.

Popliteal Vein: No evidence of thrombus. Normal compressibility,
respiratory phasicity and response to augmentation.

Calf Veins: No evidence of thrombus. Normal compressibility and flow
on color Doppler imaging.

Superficial Great Saphenous Vein: No evidence of thrombus. Normal
compressibility.

Venous Reflux:  None.

Other Findings:  None.

LEFT LOWER EXTREMITY

Common Femoral Vein: No evidence of thrombus. Normal
compressibility, respiratory phasicity and response to augmentation.

Saphenofemoral Junction: No evidence of thrombus. Normal
compressibility and flow on color Doppler imaging.

Profunda Femoral Vein: No evidence of thrombus. Normal
compressibility and flow on color Doppler imaging.

Femoral Vein: No evidence of thrombus. Normal compressibility,
respiratory phasicity and response to augmentation.

Popliteal Vein: No evidence of thrombus. Normal compressibility,
respiratory phasicity and response to augmentation.

Calf Veins: No evidence of thrombus. Normal compressibility and flow
on color Doppler imaging.

Superficial Great Saphenous Vein: No evidence of thrombus. Normal
compressibility.

Venous Reflux:  None.

Other Findings:  None.
IMPRESSION: No evidence of deep venous thrombosis in either lower extremity.

## 2019-12-10 IMAGING — CT CT ABD-PELV W/ CM
2 of 5 series · 15 of 46 positions shown, 17 images · IV contrast (APPLIED)
Comparison: None.

CLINICAL DATA: Bloody stools.

EXAM:
CT ABDOMEN AND PELVIS WITH CONTRAST
TECHNIQUE: Multidetector CT imaging of the abdomen and pelvis was performed
using the standard protocol following bolus administration of
intravenous contrast.
CONTRAST:  75mL [id number] IOPAMIDOL ([id number]) INJECTION 61%

[Series 2: routine abd/pel with · axial · 0.74mm/px · z∈[-1068,-638]mm · 12 of 98 slices shown, 14 images]
[im 6/98  soft-tissue]
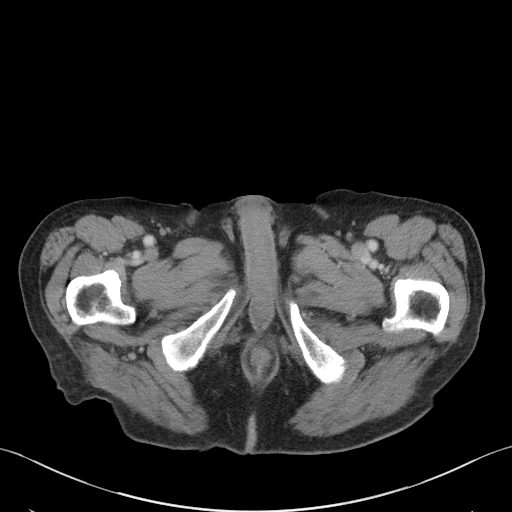
[im 6/98  bone]
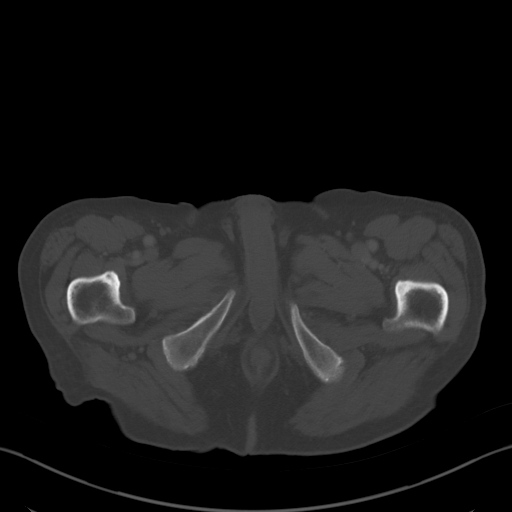
[im 17/98  soft-tissue]
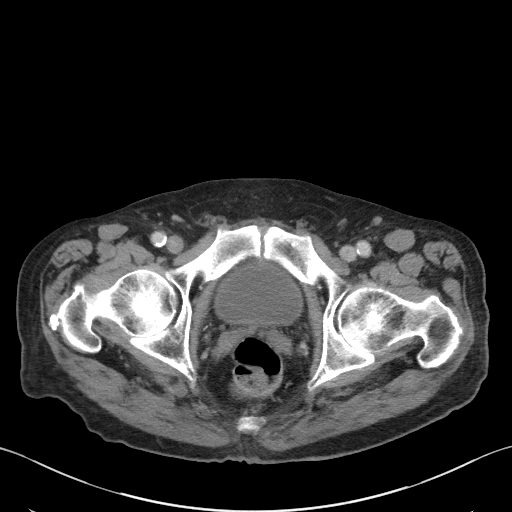
[im 22/98  soft-tissue]
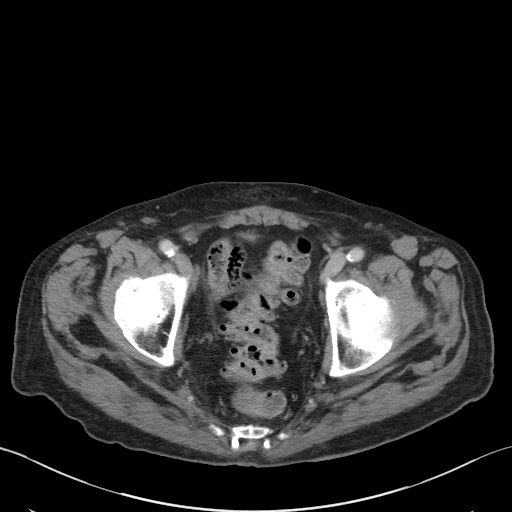
[im 27/98  soft-tissue]
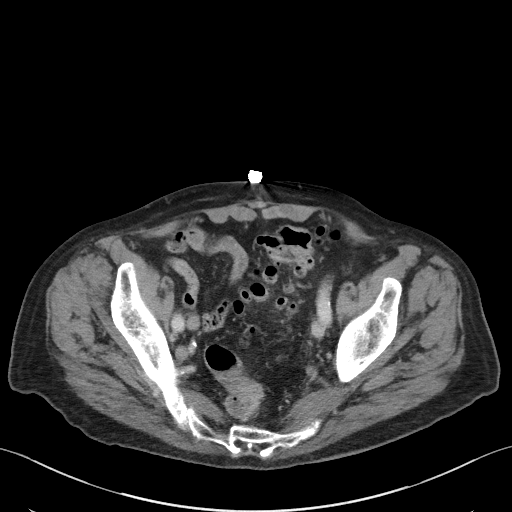
[im 38/98  soft-tissue]
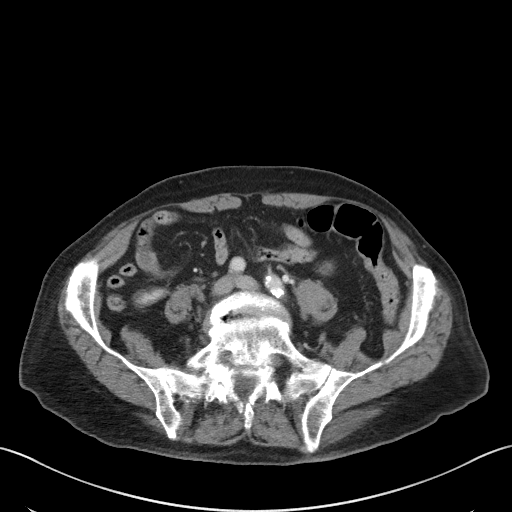
[im 44/98  soft-tissue]
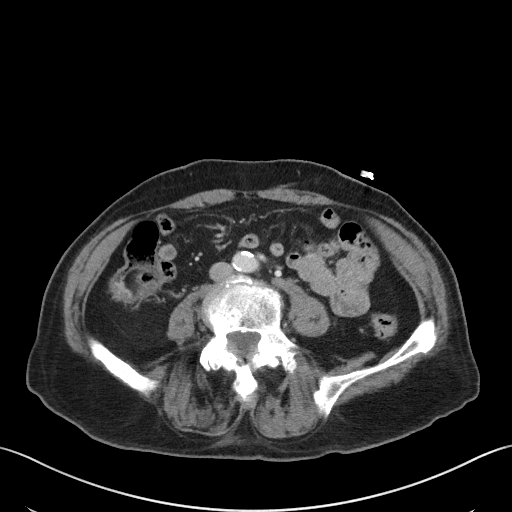
[im 54/98  soft-tissue]
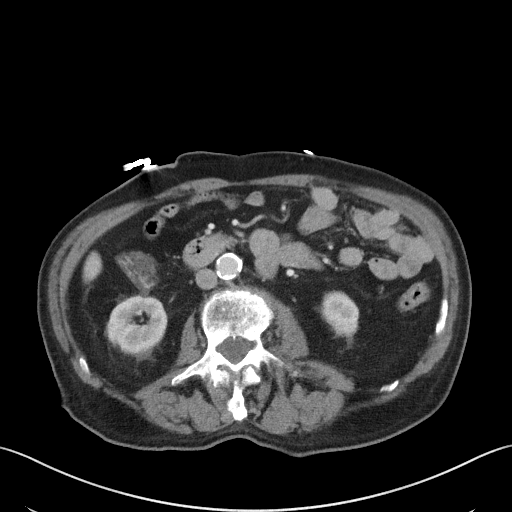
[im 60/98  soft-tissue]
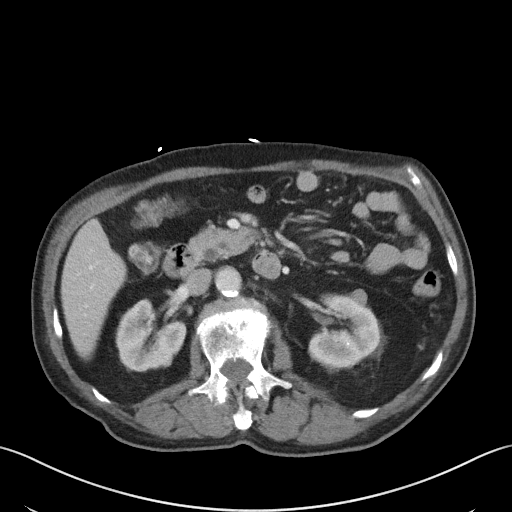
[im 71/98  soft-tissue]
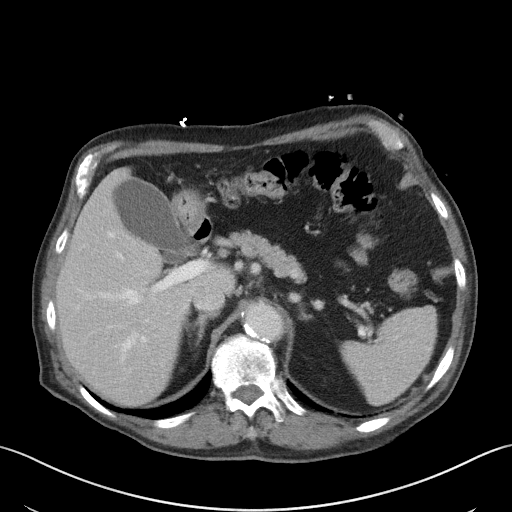
[im 71/98  bone]
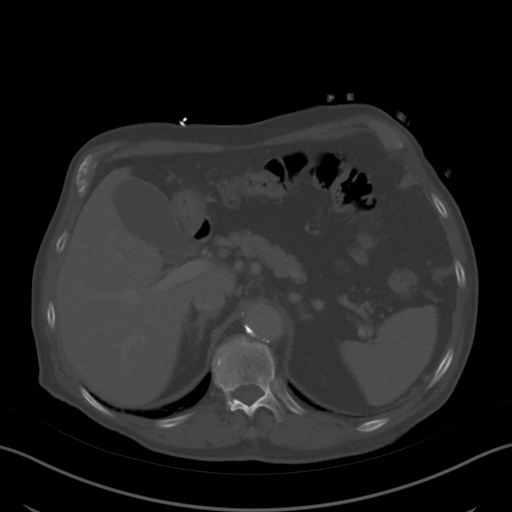
[im 76/98  soft-tissue]
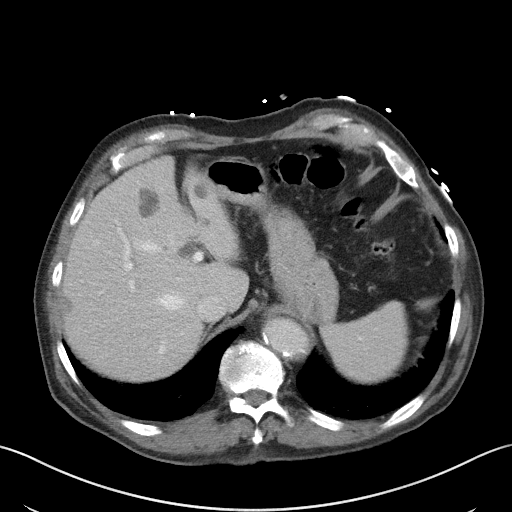
[im 81/98  soft-tissue]
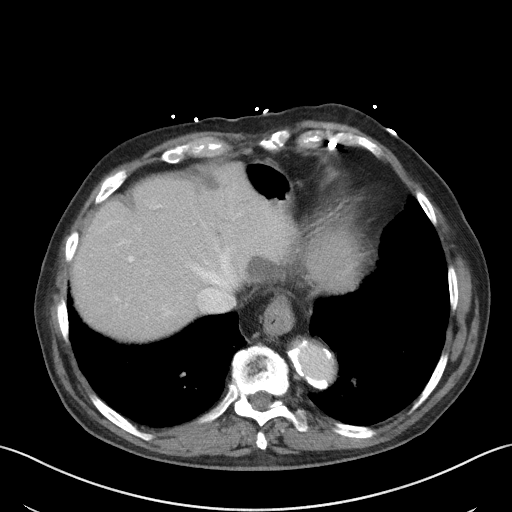
[im 92/98  soft-tissue]
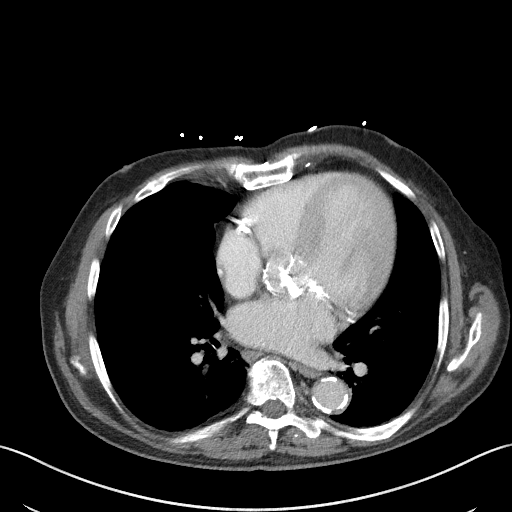

[Series 5: coronal st · coronal · 0.71mm/px · 3 of 90 slices shown]
[im 30/90  soft-tissue]
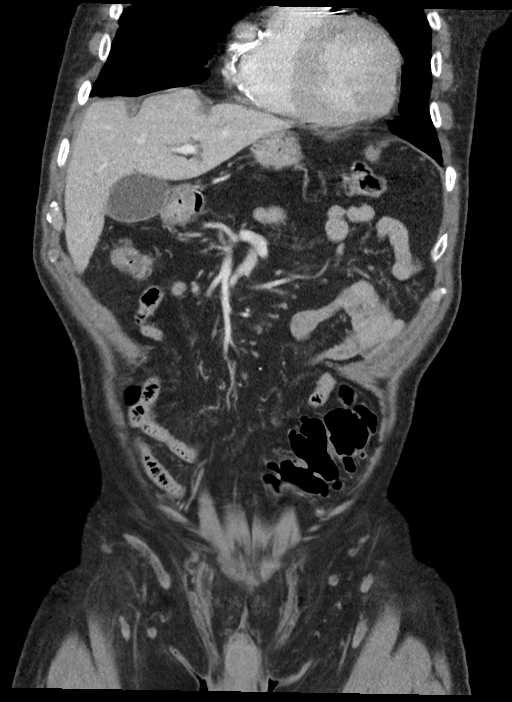
[im 40/90  soft-tissue]
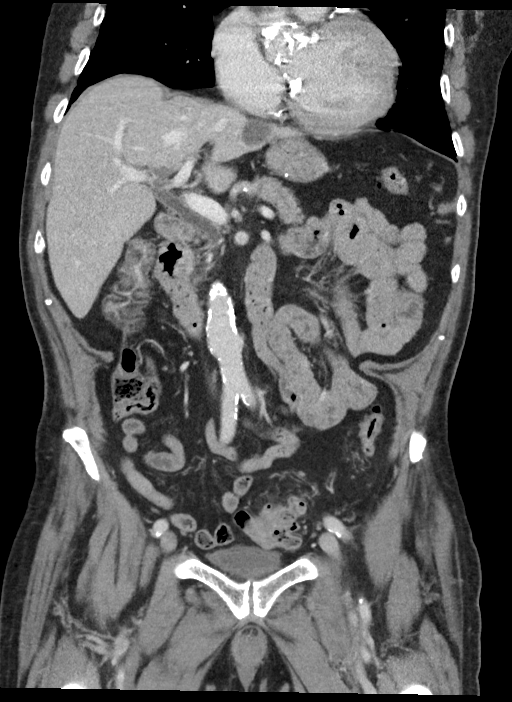
[im 50/90  soft-tissue]
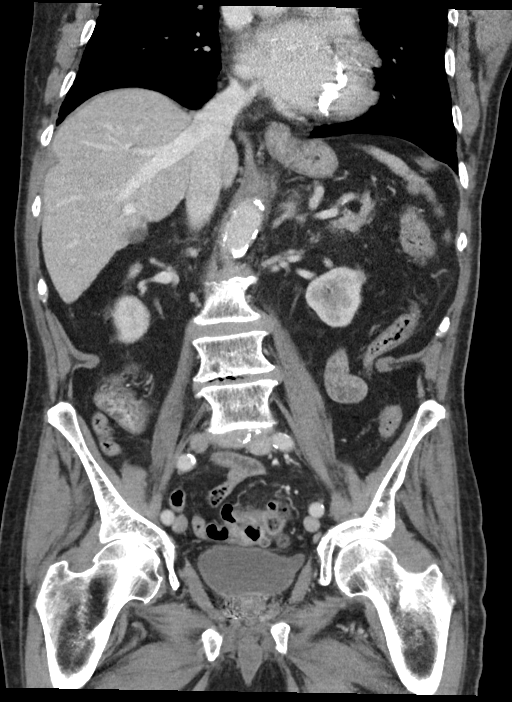

[15 of 46 positions shown; findings below may reference images not displayed]

FINDINGS: Lower chest: Heart is enlarged. Coronary artery calcification is
evident. Atherosclerotic calcification is noted in the wall of the
thoracic aorta.

Hepatobiliary: Hepatic cysts measure up to 3.1 cm diameter. There is
no evidence for gallstones, gallbladder wall thickening, or
pericholecystic fluid. No intrahepatic or extrahepatic biliary
dilation. Extrahepatic bile duct measures 8 mm diameter, within
normal limits for age. Patient is noted to have a 7 x 7 x 10 mm
stone in the distal common bile duct ([DATE] and well demonstrated
coronal image 40 series 5).

Pancreas: 12 mm cystic lesion identified in the tail of pancreas
([DATE]). No dilatation of the main duct.

Spleen: No splenomegaly. No focal mass lesion.

Adrenals/Urinary Tract: No adrenal nodule or mass. 11 mm exophytic
lesion interpolar left kidney has attenuation too high to be a
simple cyst. Similar 7 mm lesion identified interpolar right kidney.
No evidence for hydroureter. Small left-sided bladder diverticulum
evident.

Stomach/Bowel: Small hiatal hernia. Stomach otherwise unremarkable.
Duodenum is normally positioned as is the ligament of Treitz. No
small bowel wall thickening. No small bowel dilatation. The terminal
ileum is normal. The appendix is normal. Advanced diverticular
changes noted left colon without definite findings of
diverticulitis.

Vascular/Lymphatic: There is abdominal aortic atherosclerosis
without aneurysm. There is no gastrohepatic or hepatoduodenal
ligament lymphadenopathy. No intraperitoneal or retroperitoneal
lymphadenopathy. No pelvic sidewall lymphadenopathy.

Reproductive: Brachytherapy seeds noted prostate

Other: No intraperitoneal free fluid.

Musculoskeletal: No worrisome lytic or sclerotic osseous
abnormality. Degenerative disc disease noted lumbar spine.
IMPRESSION: dilatation. No evidence for cholelithiasis.
2. 12 mm cystic lesion tail of pancreas. In a patient of this age
with a cyst of this size, consensus criteria recommend reimaging
every 2 years x2. Repeat CT abdomen with contrast in 12 months
recommended. This recommendation follows ACR consensus guidelines:
Management of Incidental Pancreatic Cysts: A White Paper of the ACR
Incidental Findings Committee. [HOSPITAL] 8731;[DATE].
3. Advanced diverticular disease in the left colon without overt
features of diverticulitis.
4. 11 mm exophytic lesion interpolar left kidney with attenuation
too high to be a simple cyst. While this may be a cyst complicated
by proteinaceous debris or hemorrhage, renal neoplasm could have
similar CT imaging characteristics. Repeat imaging in 6 months could
be used to ensure stability.
5.  Aortic Atherosclerois (AZILK-170.0)

## 2019-12-15 IMAGING — DX DG CHEST 1V PORT
1 series · 1 of 1 positions shown · non-contrast
Comparison: 10/18/2017

CLINICAL DATA: Chest pain and weakness today

EXAM:
PORTABLE CHEST 1 VIEW

[chest ap]
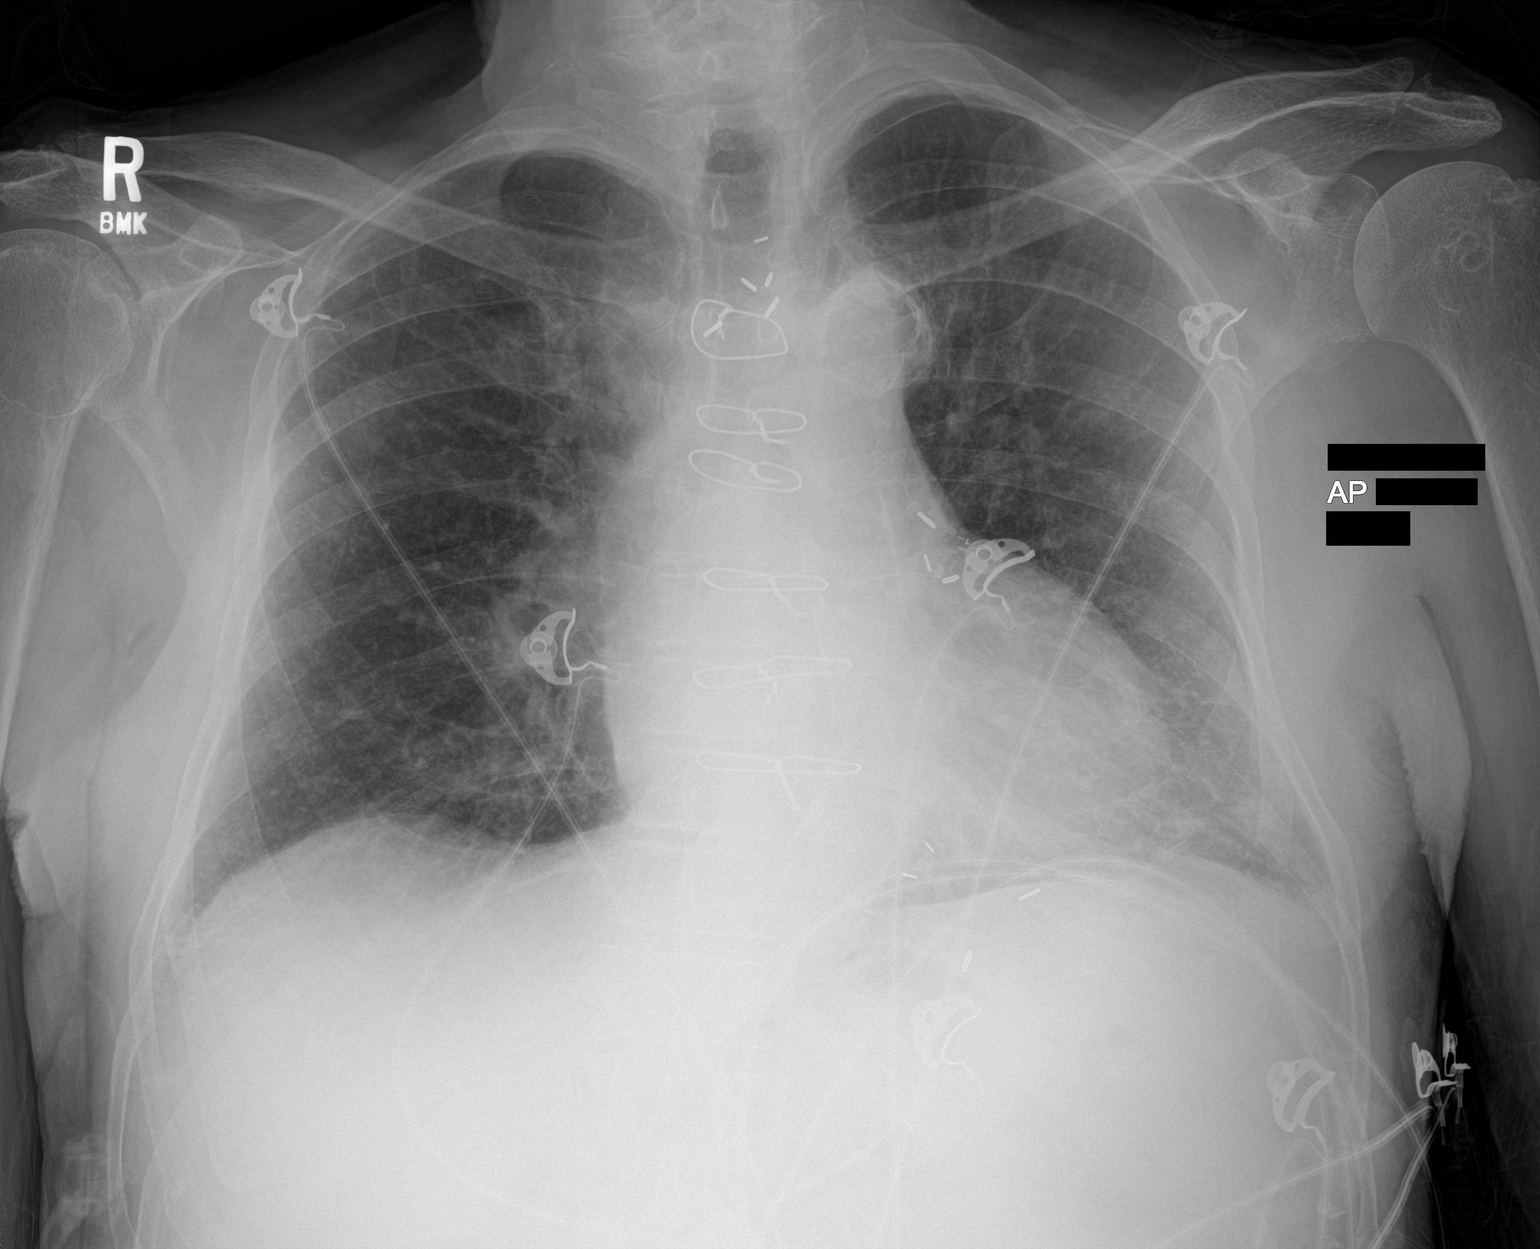

[1 of 1 positions shown; findings below may reference images not displayed]

FINDINGS: Midline trachea. Mild cardiomegaly. Prior median sternotomy.
Atherosclerosis in the transverse aorta. No pleural effusion or
pneumothorax. Low lung volumes with resultant pulmonary interstitial
prominence. Mild left base scarring or subsegmental atelectasis.
IMPRESSION: No acute cardiopulmonary disease.

Cardiomegaly without congestive failure.
